# Patient Record
Sex: Female | Born: 1955 | Race: White | Hispanic: No | Marital: Single | Smoking: Never smoker
Health system: Southern US, Community
[De-identification: ages and names within clinical notes are randomized; demographics above are authoritative.]

## PROBLEM LIST (undated history)

## (undated) DIAGNOSIS — J449 Chronic obstructive pulmonary disease, unspecified: Secondary | ICD-10-CM

## (undated) DIAGNOSIS — N3010 Interstitial cystitis (chronic) without hematuria: Secondary | ICD-10-CM

## (undated) DIAGNOSIS — K589 Irritable bowel syndrome without diarrhea: Secondary | ICD-10-CM

## (undated) DIAGNOSIS — N200 Calculus of kidney: Secondary | ICD-10-CM

## (undated) HISTORY — PX: FOOT SURGERY: SHX648

## (undated) HISTORY — PX: ABDOMINAL HYSTERECTOMY: SHX81

## (undated) HISTORY — PX: KNEE SURGERY: SHX244

---

## 1997-08-02 ENCOUNTER — Ambulatory Visit (HOSPITAL_COMMUNITY): Admission: RE | Admit: 1997-08-02 | Discharge: 1997-08-02 | Payer: Self-pay | Admitting: *Deleted

## 1998-03-05 ENCOUNTER — Ambulatory Visit (HOSPITAL_COMMUNITY): Admission: RE | Admit: 1998-03-05 | Discharge: 1998-03-05 | Payer: Self-pay | Admitting: *Deleted

## 1998-03-05 ENCOUNTER — Other Ambulatory Visit: Admission: RE | Admit: 1998-03-05 | Discharge: 1998-03-05 | Payer: Self-pay | Admitting: *Deleted

## 1998-04-04 ENCOUNTER — Ambulatory Visit (HOSPITAL_COMMUNITY): Admission: RE | Admit: 1998-04-04 | Discharge: 1998-04-04 | Payer: Self-pay | Admitting: *Deleted

## 1998-06-05 ENCOUNTER — Other Ambulatory Visit: Admission: RE | Admit: 1998-06-05 | Discharge: 1998-06-05 | Payer: Self-pay | Admitting: Gastroenterology

## 1998-12-22 ENCOUNTER — Encounter: Admission: RE | Admit: 1998-12-22 | Discharge: 1999-03-22 | Payer: Self-pay | Admitting: *Deleted

## 1999-03-10 ENCOUNTER — Other Ambulatory Visit: Admission: RE | Admit: 1999-03-10 | Discharge: 1999-03-10 | Payer: Self-pay | Admitting: *Deleted

## 2000-07-07 ENCOUNTER — Encounter (INDEPENDENT_AMBULATORY_CARE_PROVIDER_SITE_OTHER): Payer: Self-pay

## 2000-07-07 ENCOUNTER — Other Ambulatory Visit: Admission: RE | Admit: 2000-07-07 | Discharge: 2000-07-07 | Payer: Self-pay | Admitting: Obstetrics and Gynecology

## 2001-08-16 ENCOUNTER — Ambulatory Visit (HOSPITAL_COMMUNITY): Admission: RE | Admit: 2001-08-16 | Discharge: 2001-08-16 | Payer: Self-pay | Admitting: Family Medicine

## 2001-08-16 ENCOUNTER — Encounter: Payer: Self-pay | Admitting: Family Medicine

## 2002-01-13 ENCOUNTER — Encounter: Payer: Self-pay | Admitting: Family Medicine

## 2002-01-13 ENCOUNTER — Encounter: Admission: RE | Admit: 2002-01-13 | Discharge: 2002-01-13 | Payer: Self-pay | Admitting: Family Medicine

## 2002-03-09 ENCOUNTER — Encounter: Admission: RE | Admit: 2002-03-09 | Discharge: 2002-03-09 | Payer: Self-pay | Admitting: *Deleted

## 2002-03-09 ENCOUNTER — Encounter: Payer: Self-pay | Admitting: *Deleted

## 2002-04-03 ENCOUNTER — Ambulatory Visit (HOSPITAL_COMMUNITY): Admission: RE | Admit: 2002-04-03 | Discharge: 2002-04-04 | Payer: Self-pay | Admitting: Cardiology

## 2005-02-11 ENCOUNTER — Encounter: Admission: RE | Admit: 2005-02-11 | Discharge: 2005-02-11 | Payer: Self-pay | Admitting: Internal Medicine

## 2005-07-08 ENCOUNTER — Ambulatory Visit (HOSPITAL_COMMUNITY): Admission: RE | Admit: 2005-07-08 | Discharge: 2005-07-08 | Payer: Self-pay | Admitting: Internal Medicine

## 2007-05-17 ENCOUNTER — Emergency Department (HOSPITAL_COMMUNITY): Admission: EM | Admit: 2007-05-17 | Discharge: 2007-05-17 | Payer: Self-pay | Admitting: Emergency Medicine

## 2008-01-01 ENCOUNTER — Ambulatory Visit: Payer: Self-pay | Admitting: Urology

## 2008-01-24 ENCOUNTER — Ambulatory Visit: Payer: Self-pay | Admitting: Urology

## 2008-02-01 ENCOUNTER — Ambulatory Visit: Payer: Self-pay | Admitting: Urology

## 2008-02-15 ENCOUNTER — Ambulatory Visit: Payer: Self-pay | Admitting: Urology

## 2008-04-05 ENCOUNTER — Ambulatory Visit: Payer: Self-pay | Admitting: Urology

## 2008-10-30 ENCOUNTER — Ambulatory Visit (HOSPITAL_COMMUNITY): Admission: RE | Admit: 2008-10-30 | Discharge: 2008-10-30 | Payer: Self-pay | Admitting: Family Medicine

## 2008-12-10 ENCOUNTER — Ambulatory Visit: Payer: Self-pay | Admitting: Urology

## 2009-02-05 ENCOUNTER — Emergency Department (HOSPITAL_COMMUNITY): Admission: EM | Admit: 2009-02-05 | Discharge: 2009-02-05 | Payer: Self-pay | Admitting: Family Medicine

## 2009-04-15 ENCOUNTER — Emergency Department (HOSPITAL_COMMUNITY): Admission: EM | Admit: 2009-04-15 | Discharge: 2009-04-15 | Payer: Self-pay | Admitting: Family Medicine

## 2009-05-11 ENCOUNTER — Emergency Department (HOSPITAL_COMMUNITY): Admission: EM | Admit: 2009-05-11 | Discharge: 2009-05-11 | Payer: Self-pay | Admitting: Family Medicine

## 2009-05-19 ENCOUNTER — Ambulatory Visit: Payer: Self-pay | Admitting: Internal Medicine

## 2009-06-11 ENCOUNTER — Ambulatory Visit: Payer: Self-pay | Admitting: Internal Medicine

## 2009-07-07 ENCOUNTER — Emergency Department (HOSPITAL_COMMUNITY): Admission: EM | Admit: 2009-07-07 | Discharge: 2009-07-07 | Payer: Self-pay | Admitting: Emergency Medicine

## 2009-07-16 ENCOUNTER — Ambulatory Visit (HOSPITAL_COMMUNITY): Admission: RE | Admit: 2009-07-16 | Discharge: 2009-07-16 | Payer: Self-pay | Admitting: Orthopedic Surgery

## 2009-08-11 ENCOUNTER — Ambulatory Visit: Payer: Self-pay | Admitting: Internal Medicine

## 2009-08-11 LAB — CONVERTED CEMR LAB
ALT: 11 units/L (ref 0–35)
Albumin: 4.3 g/dL (ref 3.5–5.2)
Alkaline Phosphatase: 79 units/L (ref 39–117)
Basophils Absolute: 0 10*3/uL (ref 0.0–0.1)
Cholesterol: 124 mg/dL (ref 0–200)
Creatinine, Ser: 0.57 mg/dL (ref 0.40–1.20)
Hemoglobin: 13.9 g/dL (ref 12.0–15.0)
Hgb A1c MFr Bld: 7.5 % — ABNORMAL HIGH (ref <5.7)
Lymphocytes Relative: 24 % (ref 12–46)
Lymphs Abs: 1.9 10*3/uL (ref 0.7–4.0)
Microalb, Ur: 1.59 mg/dL (ref 0.00–1.89)
Monocytes Relative: 7 % (ref 3–12)
Neutro Abs: 5.3 10*3/uL (ref 1.7–7.7)
RBC: 4.77 M/uL (ref 3.87–5.11)
RDW: 14.7 % (ref 11.5–15.5)
TSH: 0.809 microintl units/mL (ref 0.350–4.500)
Total CHOL/HDL Ratio: 3
Vit D, 25-Hydroxy: 36 ng/mL (ref 30–89)
WBC: 7.8 10*3/uL (ref 4.0–10.5)

## 2009-08-18 ENCOUNTER — Ambulatory Visit: Payer: Self-pay | Admitting: Internal Medicine

## 2009-08-19 ENCOUNTER — Encounter (INDEPENDENT_AMBULATORY_CARE_PROVIDER_SITE_OTHER): Payer: Self-pay | Admitting: Family Medicine

## 2009-11-18 ENCOUNTER — Ambulatory Visit: Payer: Self-pay | Admitting: Internal Medicine

## 2009-12-04 ENCOUNTER — Encounter (INDEPENDENT_AMBULATORY_CARE_PROVIDER_SITE_OTHER): Payer: Self-pay | Admitting: Family Medicine

## 2009-12-04 ENCOUNTER — Ambulatory Visit: Payer: Self-pay | Admitting: Internal Medicine

## 2009-12-04 LAB — CONVERTED CEMR LAB
Helicobacter Pylori Antibody-IgG: 0.5
Lipase: 50 U/L

## 2009-12-10 ENCOUNTER — Ambulatory Visit: Payer: Self-pay | Admitting: Urology

## 2010-04-15 ENCOUNTER — Encounter (INDEPENDENT_AMBULATORY_CARE_PROVIDER_SITE_OTHER): Payer: Self-pay | Admitting: *Deleted

## 2010-04-15 LAB — CONVERTED CEMR LAB
Albumin: 4.5 g/dL (ref 3.5–5.2)
CO2: 27 meq/L (ref 19–32)
Chloride: 103 meq/L (ref 96–112)
Cholesterol: 142 mg/dL (ref 0–200)
Creatinine, Ser: 0.51 mg/dL (ref 0.40–1.20)
Glucose, Bld: 101 mg/dL — ABNORMAL HIGH (ref 70–99)
HDL: 47 mg/dL (ref >39)
Total CHOL/HDL Ratio: 3

## 2010-04-19 ENCOUNTER — Encounter: Payer: Self-pay | Admitting: Internal Medicine

## 2010-06-16 LAB — GLUCOSE, CAPILLARY
Glucose-Capillary: 126 mg/dL — ABNORMAL HIGH (ref 70–99)
Glucose-Capillary: 147 mg/dL — ABNORMAL HIGH (ref 70–99)
Glucose-Capillary: 165 mg/dL — ABNORMAL HIGH (ref 70–99)
Glucose-Capillary: 166 mg/dL — ABNORMAL HIGH (ref 70–99)

## 2010-06-16 LAB — CBC
HCT: 42.5 % (ref 36.0–46.0)
Hemoglobin: 14.6 g/dL (ref 12.0–15.0)
MCHC: 34.3 g/dL (ref 30.0–36.0)
MCV: 89.4 fL (ref 78.0–100.0)
Platelets: 311 10*3/uL (ref 150–400)
RBC: 4.75 MIL/uL (ref 3.87–5.11)
RDW: 13 % (ref 11.5–15.5)
WBC: 11.2 10*3/uL — ABNORMAL HIGH (ref 4.0–10.5)

## 2010-06-16 LAB — BASIC METABOLIC PANEL
BUN: 10 mg/dL (ref 6–23)
CO2: 30 mEq/L (ref 19–32)
Calcium: 9.7 mg/dL (ref 8.4–10.5)
Chloride: 99 mEq/L (ref 96–112)
Creatinine, Ser: 0.67 mg/dL (ref 0.4–1.2)
GFR calc Af Amer: >60 mL/min (ref >60)
GFR calc non Af Amer: >60 mL/min (ref >60)
Glucose, Bld: 193 mg/dL — ABNORMAL HIGH (ref 70–99)
Potassium: 4.1 mEq/L (ref 3.5–5.1)
Sodium: 137 mEq/L (ref 135–145)

## 2010-08-11 ENCOUNTER — Inpatient Hospital Stay (INDEPENDENT_AMBULATORY_CARE_PROVIDER_SITE_OTHER)
Admission: RE | Admit: 2010-08-11 | Discharge: 2010-08-11 | Disposition: A | Discharge status: Home / Self Care | Payer: Self-pay | Source: Ambulatory Visit | Attending: Family Medicine | Admitting: Family Medicine

## 2010-08-11 DIAGNOSIS — N12 Tubulo-interstitial nephritis, not specified as acute or chronic: Secondary | ICD-10-CM

## 2010-08-11 LAB — POCT URINALYSIS DIP (DEVICE)
Glucose, UA: NEGATIVE mg/dL
Nitrite: POSITIVE — AB
Protein, ur: NEGATIVE mg/dL
Specific Gravity, Urine: 1.015 (ref 1.005–1.030)
pH: 5.5 (ref 5.0–8.0)

## 2010-08-11 LAB — DIFFERENTIAL
Eosinophils Relative: 0 % (ref 0–5)
Lymphocytes Relative: 5 % — ABNORMAL LOW (ref 12–46)
Lymphs Abs: 0.9 10*3/uL (ref 0.7–4.0)
Monocytes Relative: 8 % (ref 3–12)
Neutrophils Relative %: 87 % — ABNORMAL HIGH (ref 43–77)

## 2010-08-11 LAB — POCT I-STAT, CHEM 8
Creatinine, Ser: 0.7 mg/dL (ref 0.4–1.2)
Glucose, Bld: 152 mg/dL — ABNORMAL HIGH (ref 70–99)

## 2010-08-11 LAB — CBC
HCT: 41.3 % (ref 36.0–46.0)
MCH: 30.4 pg (ref 26.0–34.0)
MCHC: 34.4 g/dL (ref 30.0–36.0)
MCV: 88.4 fL (ref 78.0–100.0)
RBC: 4.67 MIL/uL (ref 3.87–5.11)
RDW: 12.9 % (ref 11.5–15.5)

## 2010-08-14 NOTE — Cardiovascular Report (Signed)
   NAME:  Patricia Mccall, Patricia Mccall                           ACCOUNT NO.:  [id number]   MEDICAL RECORD NO.:  [id number]                   PATIENT TYPE:  OIB   LOCATION:  2899                                 FACILITY:  [facility]   PHYSICIAN:  Peter M. Swaziland, M.D.               DATE OF BIRTH:  June 12, 1955   DATE OF PROCEDURE:  04/03/2002  DATE OF DISCHARGE:                              CARDIAC CATHETERIZATION   INDICATIONS FOR PROCEDURE:  The patient is a 55 year old white female with a  history of obesity, diabetes mellitus and tobacco abuse.  She has had an  abnormal stress Cardiolite procedure suggesting anterior wall ischemia.   ACCESS:  Via the right femoral artery using the standard Seldinger  technique.   EQUIPMENT:  The 6 French 4 cm right and left Judkins catheter, 6 French  pigtail catheter, 6 French arterial sheath.   MEDICATIONS:  Local anesthesia with 1% Xylocaine, Versed 2 mg IV.   CONTRAST:  Omnipaque 85 cubic centimeter.   HEMODYNAMIC DATA:  Aortic pressure is 105/62 with a mean of 81.  Left  ventricular  pressure is 112 with an EDP of 21 mmHg.   ANGIOGRAPHIC DATA:  Left coronary artery:  The left coronary artery arises  and distributes normally.   Left main:  The left main coronary artery is long and normal.   Left anterior descending:  The left anterior descending artery is normal.   Left circumflex:  The left circumflex coronary artery is normal.   Right coronary artery:  The right coronary artery has minor irregularities  proximally, less than 10%.   LEFT VENTRICULAR ANGIOGRAPHY:  The left ventricular angiography performed in  the RAO view demonstrates normal left ventricular size and contractility  with normal systolic function.  Ejection fraction is estimated at 70%.  There is no mitral regurgitation or prolapse.    FINAL INTERPRETATION:  1. Normal coronary anatomy.  2. Normal left ventricular function.          Peter M. Swaziland, M.D.    PMJ/MEDQ  D:  04/03/2002  T:  04/03/2002  Job:  [id number]   cc:   Caryl Comes. Slotnick, M.D.  [address] [place]  [place] [address]  Fax: [phone]

## 2010-08-14 NOTE — H&P (Signed)
NAME:  Patricia Mccall, Patricia Mccall NO.:  [id number]   MEDICAL RECORD NO.:  [id number]                   PATIENT TYPE:   LOCATION:                                       FACILITY:   PHYSICIAN:  Peter M. Swaziland, M.D.               DATE OF BIRTH:  1955/07/16   DATE OF ADMISSION:  04/03/2002  DATE OF DISCHARGE:                                HISTORY & PHYSICAL   HISTORY OF PRESENT ILLNESS:  The patient is a 55 year old white female with  a history of obesity, tobacco abuse, and type 2 diabetes mellitus.  She  presented with symptoms of just not feeling well.  She denied any specific  chest discomfort or shortness of breath.  She underwent a screening exercise  stress test with Dr. Lowanda Foster, and was noted to have some ST segment  changes, but had no chest pain.  She subsequently underwent an Adenosine  Cardiolite study which showed a moderate to large defect in the anterior  wall consistent with ischemia.  Her left ventricular function was mildly  depressed, with an ejection fraction of 45%.  Based on these findings,  cardiac catheterization has been recommended, and she is now admitted for  that procedure.   PAST MEDICAL HISTORY:  1. Interstitial cystitis.  2. History of irritable bowel syndrome.  3. History of type 2 diabetes mellitus.   PAST SURGICAL HISTORY:  1. Hysterectomy.  2. Bladder surgery.  3. Cesarean section.  4. Foot surgery.   ALLERGIES:  1. AUGMENTIN.  2. She has had some GI upset with ASPIRIN in the past.   MEDICATIONS:  1. Elmiron 100 mg t.i.d.  2. Buspirone 50 mg b.i.d.  3. Prevacid 30 mg q.d.  4. Glucotrol XL 2.5 mg q.d.  5. Glucophage 500 mg b.i.d.  6. Effexor XR 150 mg q.d.  7. Astelin 34 cc two sprays q.d.  8. Flonase p.r.n.  9. Roxicet p.r.n.  10.      Univasc 7.5 mg q.d.  11.      Toprol XL 50 mg q.d.  12.      Coated aspirin q.d.   SOCIAL HISTORY:  The patient works at [employer].  She is single.  She has one  child.  She smokes 1-1/2 packs per day, and has smoked for 30  years.  She denies alcohol use.   FAMILY HISTORY:  Father is age 38, in good health.  Mother is age 38, and  has diabetes.  One sister has diabetes.  One sister is in good health.   REVIEW OF SYMPTOMS:  The patient complains of arthritic pain in her right  knee.  She has no edema.  She denies orthopnea or PND.  All other review of  systems are negative.   PHYSICAL EXAMINATION:  GENERAL:  The patient is an obese white female in no  acute distress.  VITAL SIGNS:  Weight is 224, blood pressure is 132/80, pulse is 80 and  regular.  HEENT:  Pupils equal, round, reactive to light and accommodation.  Extraocular movements are full.  Oropharynx is clear.  NECK:  Without JVD, adenopathy, thyromegaly, or bruits.  LUNGS:  Clear to auscultation and percussion.  CARDIAC:  Regular rate and rhythm without murmurs, rubs, gallops, or clicks.  ABDOMEN:  Soft and nontender.  She has no masses or bruits.  EXTREMITIES:  Without edema.  Pulses are 2+ and symmetric.  NEUROLOGIC:  Nonfocal.   LABORATORY DATA:  Electrocardiogram shows normal sinus rhythm, normal  electrocardiogram.  Chest x-ray shows no active disease.   IMPRESSION:  1. Symptoms of fatigue.  2. Abnormal Adenosine Cardiolite study showing evidence of anterior wall     ischemia.  3. Type 2 diabetes mellitus.  4. Tobacco abuse.  5. Obesity.  6. History of irritable bowel syndrome.  7. History of interstitial cystitis.   PLAN:  The patient will be admitted for cardiac catheterization with further  therapy pending these results.                                                Peter M. Swaziland, M.D.    PMJ/MEDQ  D:  03/21/2002  T:  03/21/2002  Job:  [id number]   cc:   Caryl Comes. Slotnick, M.D.  [address] [place]  [place] [address]  Fax: [phone]

## 2011-04-19 ENCOUNTER — Emergency Department (INDEPENDENT_AMBULATORY_CARE_PROVIDER_SITE_OTHER): Payer: Self-pay

## 2011-04-19 ENCOUNTER — Encounter (HOSPITAL_COMMUNITY): Payer: Self-pay

## 2011-04-19 ENCOUNTER — Emergency Department (HOSPITAL_COMMUNITY)
Admission: EM | Admit: 2011-04-19 | Discharge: 2011-04-19 | Disposition: A | Discharge status: Home / Self Care | Payer: Self-pay | Source: Home / Self Care | Attending: Emergency Medicine | Admitting: Emergency Medicine

## 2011-04-19 DIAGNOSIS — M659 Synovitis and tenosynovitis, unspecified: Secondary | ICD-10-CM

## 2011-04-19 DIAGNOSIS — M7750 Other enthesopathy of unspecified foot: Secondary | ICD-10-CM

## 2011-04-19 HISTORY — DX: Calculus of kidney: N20.0

## 2011-04-19 HISTORY — DX: Chronic obstructive pulmonary disease, unspecified: J44.9

## 2011-04-19 HISTORY — DX: Irritable bowel syndrome without diarrhea: K58.9

## 2011-04-19 HISTORY — DX: Interstitial cystitis (chronic) without hematuria: N30.10

## 2011-04-19 MED ORDER — MELOXICAM 7.5 MG PO TABS: 7.5000 mg | ORAL_TABLET | Freq: Every day | ORAL | Status: AC

## 2011-04-19 NOTE — ED Notes (Signed)
C/o painful knot to lateral aspect of lt foot for 2 months.  Denies injury.

## 2011-04-19 NOTE — ED Provider Notes (Signed)
History     CSN: [id number]  Arrival date & time 04/19/11  1349   First MD Initiated Contact with Patient 04/19/11 1356      Chief Complaint  Patient presents with  . Foot Pain    (Consider location/radiation/quality/duration/timing/severity/associated sxs/prior treatment) HPI Comments: The side of my R foot has been hurting for two months, I do work standing up for many hours during the day"  Patient is a 56 y.o. female presenting with lower extremity pain. The history is provided by the patient.  Foot Pain This is a new problem. Episode onset: x 2 months. The problem occurs constantly. The problem has been gradually worsening. Pertinent negatives include no chest pain and no abdominal pain. The symptoms are aggravated by nothing. The symptoms are relieved by nothing.    Past Medical History  Diagnosis Date  . Diabetes mellitus   . COPD (chronic obstructive pulmonary disease)   . Irritable bowel syndrome (IBS)   . Interstitial cystitis   . Kidney stones     Past Surgical History  Procedure Date  . Abdominal hysterectomy   . Knee surgery   . Foot surgery     No family history on file.  History  Substance Use Topics  . Smoking status: Never Smoker   . Smokeless tobacco: Not on file  . Alcohol Use: No    OB History    Grav Para Term Preterm Abortions TAB SAB Ect Mult Living                  Review of Systems  Constitutional: Negative for fever and chills.  Cardiovascular: Negative for chest pain.  Gastrointestinal: Negative for abdominal pain.  Musculoskeletal: Negative for gait problem.  Neurological: Negative for weakness and numbness.    Allergies  Aspirin; Augmentin; and Levaquin  Home Medications   Current Outpatient Rx  Name Route Sig Dispense Refill  . VENTOLIN IN Inhalation Inhale into the lungs.    . BUSPIRONE HCL 15 MG PO TABS Oral Take 15 mg by mouth 2 (two) times daily.    Marland Kitchen CETIRIZINE HCL 10 MG PO TABS Oral Take 10 mg by mouth daily.      Marland Kitchen FLUTICASONE PROPIONATE 50 MCG/ACT NA SUSP Nasal Place 2 sprays into the nose daily.    . IMIPRAMINE HCL 25 MG PO TABS Oral Take 25 mg by mouth at bedtime.    Marland Kitchen LIRAGLUTIDE 18 MG/3ML [place] SOLN Subcutaneous Inject into the skin.    Marland Kitchen METFORMIN HCL 1000 MG PO TABS Oral Take 1,000 mg by mouth 2 (two) times daily with a meal.    . MONTELUKAST SODIUM 10 MG PO TABS Oral Take 10 mg by mouth at bedtime.    Marland Kitchen PANTOPRAZOLE SODIUM 40 MG PO TBEC Oral Take 40 mg by mouth daily.    Marland Kitchen PAROXETINE HCL 20 MG PO TABS Oral Take 20 mg by mouth daily.    Marland Kitchen PENTOSAN POLYSULFATE SODIUM 100 MG PO CAPS Oral Take 100 mg by mouth 3 (three) times daily before meals.    . TELMISARTAN-HCTZ 40-12.5 MG PO TABS Oral Take 1 tablet by mouth daily.    Marland Kitchen TIOTROPIUM BROMIDE MONOHYDRATE 18 MCG IN CAPS Inhalation Place 18 mcg into inhaler and inhale daily.      BP 122/66  Pulse 79  Temp(Src) 98.4 F (36.9 C) (Oral)  Resp 18  SpO2 99%  Physical Exam  Nursing note and vitals reviewed. Constitutional: She appears well-developed and well-nourished.  HENT:  Head: Normocephalic.  Neck: Neck supple.  Musculoskeletal: She exhibits tenderness. She exhibits no edema.       Right ankle: She exhibits swelling. She exhibits no deformity, no laceration and normal pulse. tenderness. Achilles tendon normal.       Left foot: She exhibits tenderness, bony tenderness and swelling. She exhibits no crepitus and no laceration.       Feet:  Neurological: She is alert. No cranial nerve deficit. She exhibits normal muscle tone. Coordination normal.  Skin: No rash noted. No erythema.    ED Course  Procedures (including critical care time)  Labs Reviewed - No data to display No results found.   No diagnosis found.    MDM  Foot pain with lateral dorsal and plantar surface reproducible pain- 5 th metatarsal regio ruling a stress fracture-        Jimmie Molly, MD 04/19/11 [phone]

## 2012-01-24 IMAGING — CR DG ELBOW COMPLETE 3+V*L*
4 series · 4 of 4 positions shown · non-contrast
Comparison: None.

CLINICAL DATA: The patient fell.  Pain.

LEFT ELBOW - COMPLETE 3+ VIEW

[x elbow joint ap left]
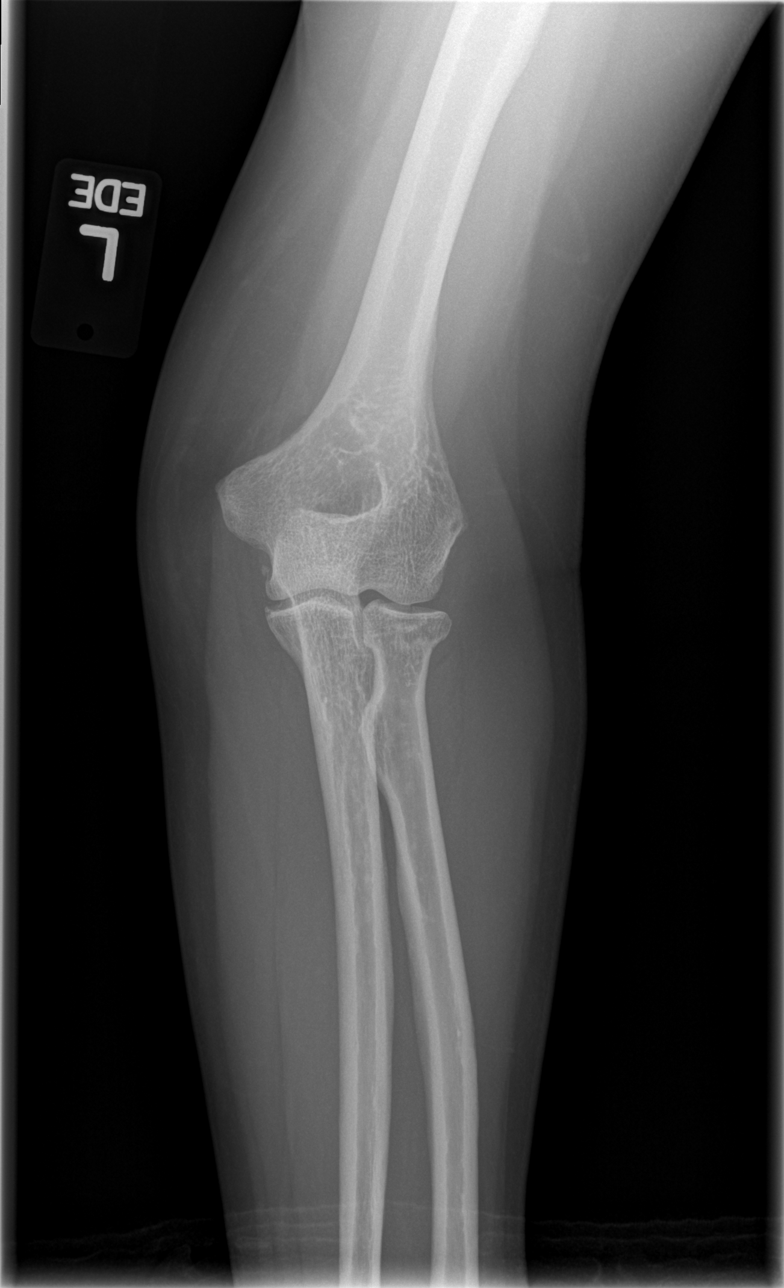

[x elbow joint obl. left (1 of 2)]
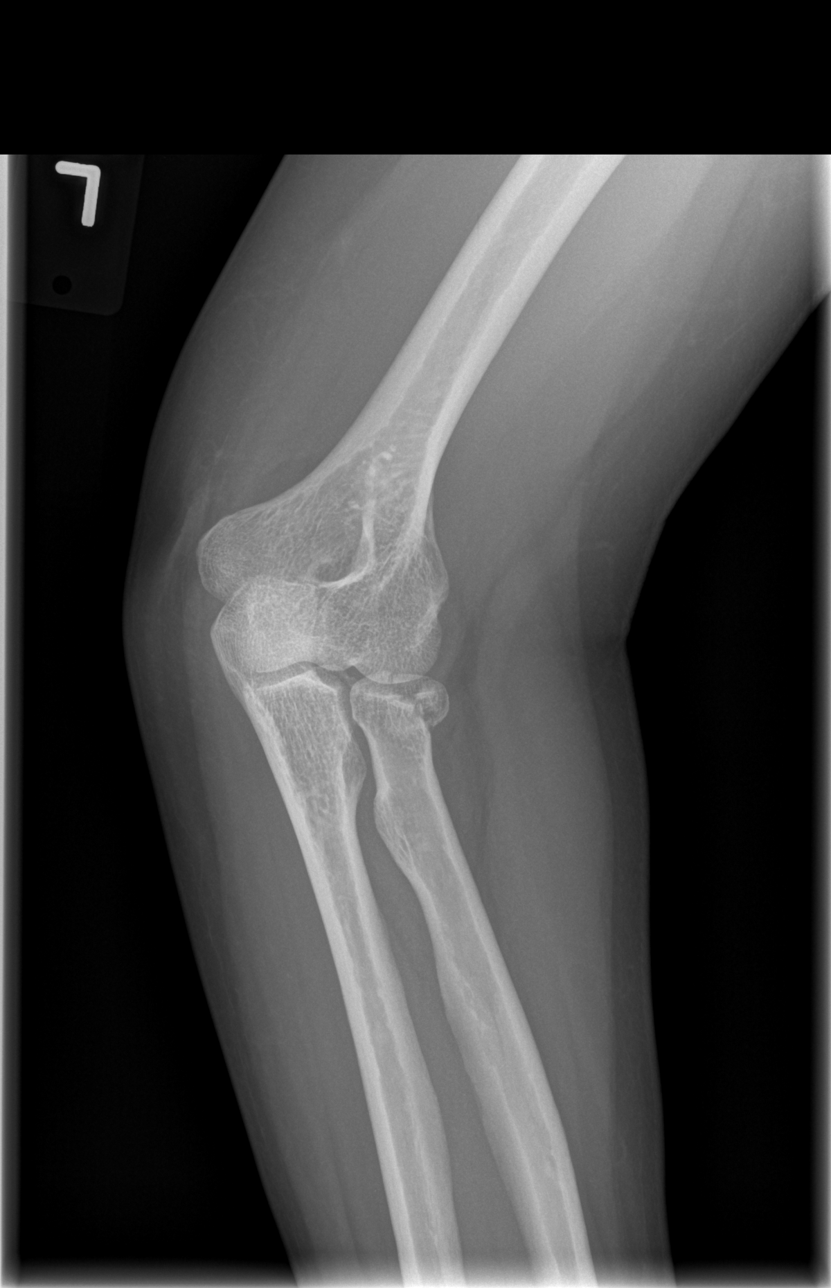

[x elbow joint obl. left (2 of 2)]
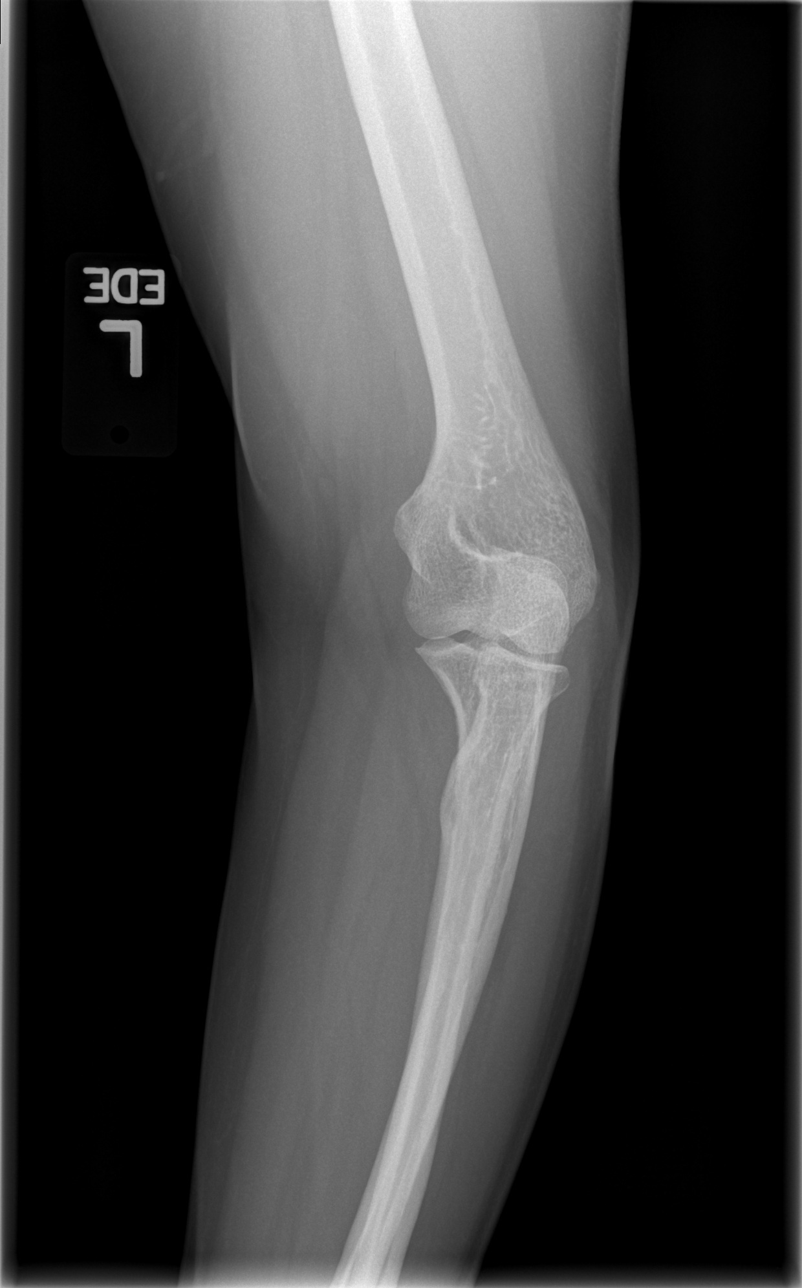

[x elbow joint lat left]
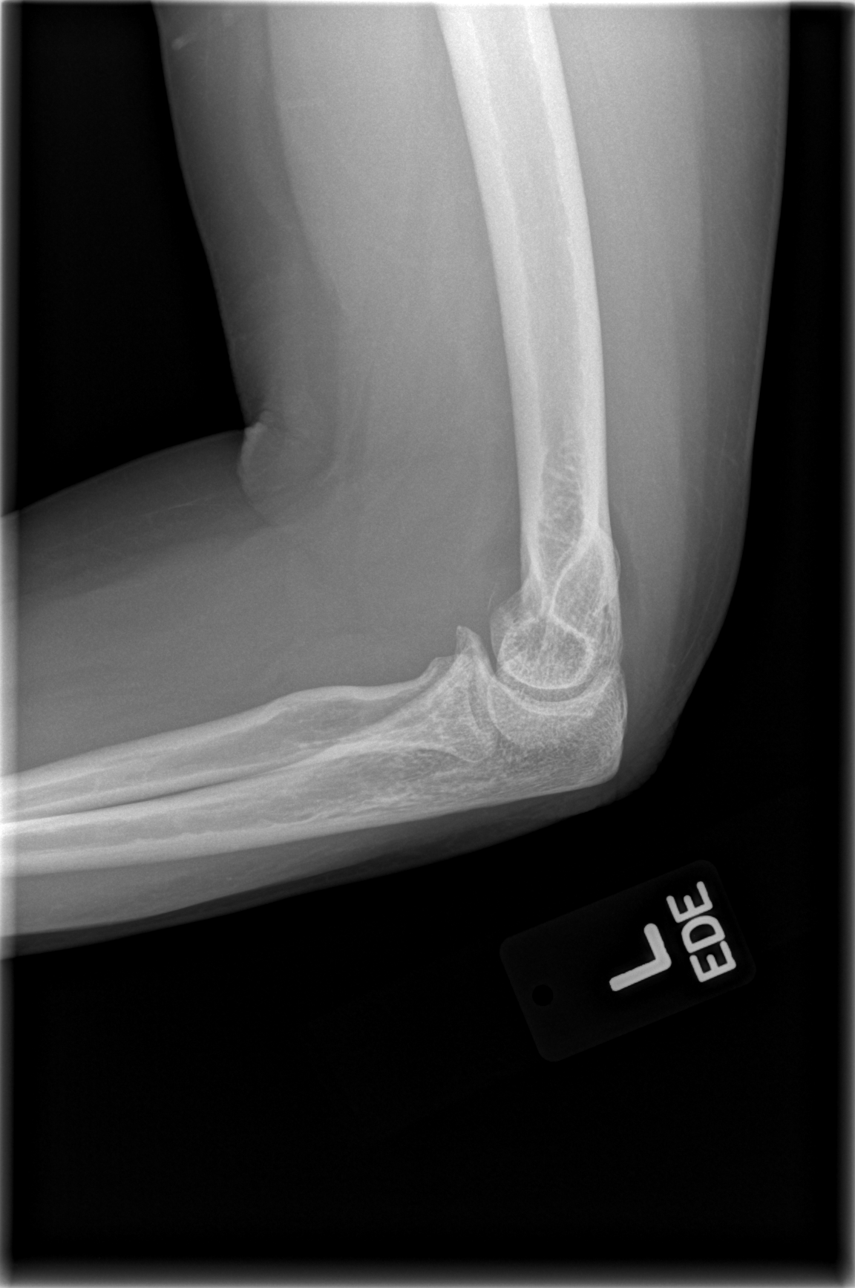

[4 of 4 positions shown; findings below may reference images not displayed]

FINDINGS: Slightly displaced fracture of the radial head.  Elbow
joint effusion/hemarthrosis.
IMPRESSION: Left radial head fracture.  Effusion.

## 2012-01-31 IMAGING — CR DG CHEST 2V
2 series · 2 of 2 positions shown · non-contrast
Comparison: February 11, 2005

CLINICAL DATA: Preoperative respiratory exam; left elbow fracture

CHEST - 2 VIEW

[view not recorded (1 of 2)]
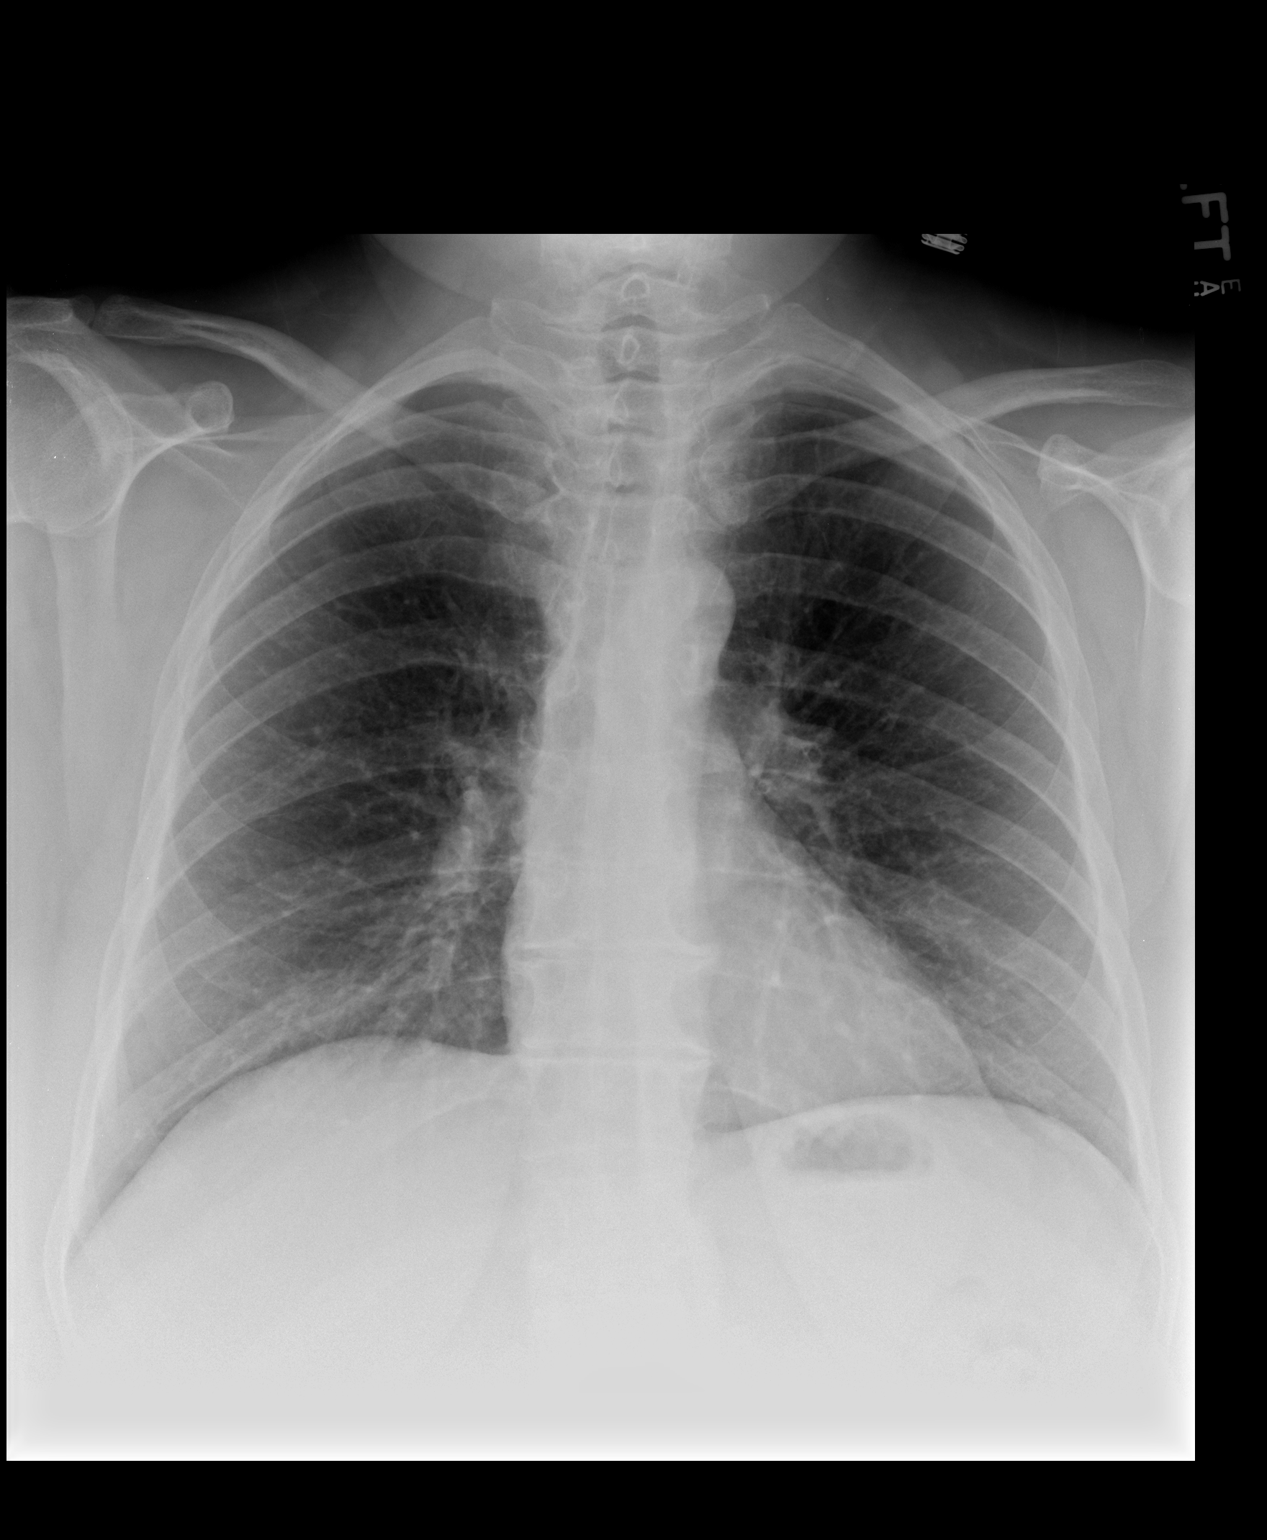

[view not recorded (2 of 2)]
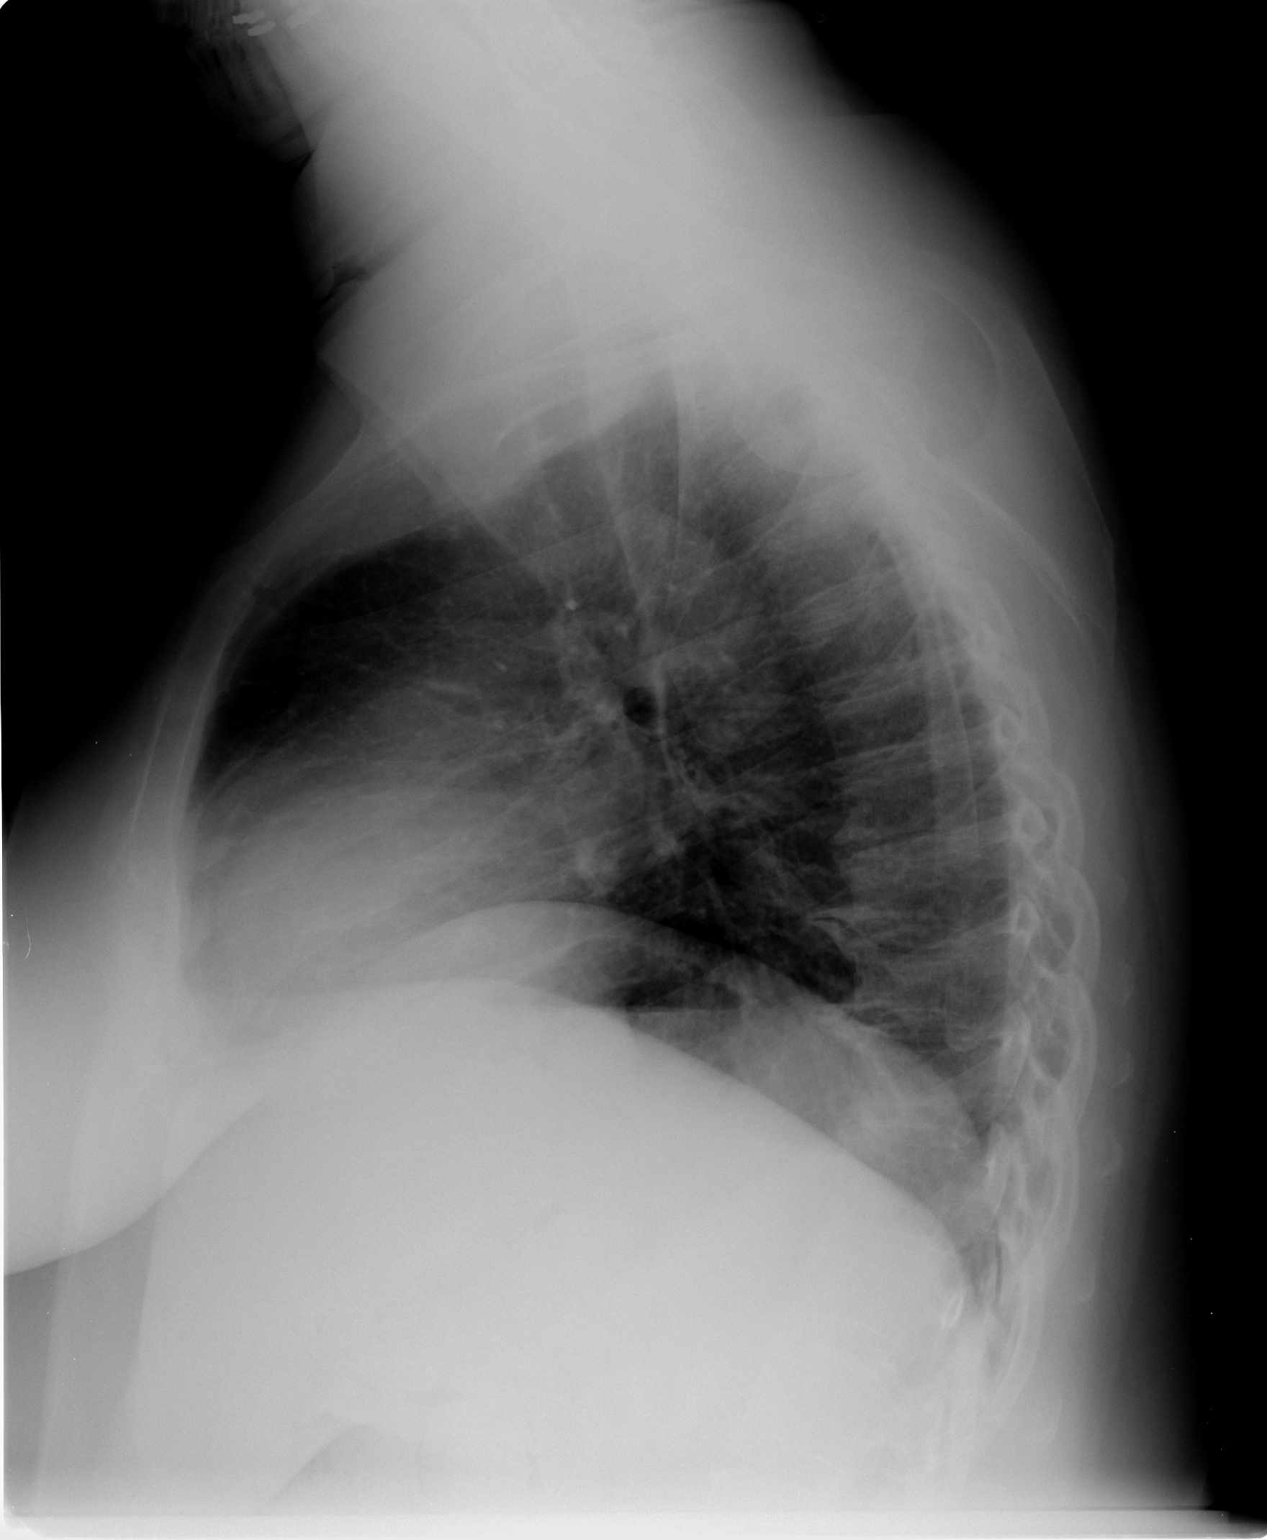

[2 of 2 positions shown; findings below may reference images not displayed]

FINDINGS: The cardiac silhouette, mediastinum, pulmonary
vasculature are within normal limits.  Both lungs are clear.
There is no acute bony abnormality.
IMPRESSION: Stable, normal chest x-ray.

## 2012-05-16 ENCOUNTER — Other Ambulatory Visit (HOSPITAL_COMMUNITY): Payer: Self-pay | Admitting: Nurse Practitioner

## 2012-05-16 DIAGNOSIS — Z1231 Encounter for screening mammogram for malignant neoplasm of breast: Secondary | ICD-10-CM

## 2012-05-16 DIAGNOSIS — Z0000 Encounter for general adult medical examination without abnormal findings: Secondary | ICD-10-CM

## 2012-05-26 ENCOUNTER — Ambulatory Visit (HOSPITAL_COMMUNITY)
Admission: RE | Admit: 2012-05-26 | Discharge: 2012-05-26 | Disposition: A | Discharge status: Home / Self Care | Payer: No Typology Code available for payment source | Source: Ambulatory Visit | Attending: Nurse Practitioner | Admitting: Nurse Practitioner

## 2012-05-26 ENCOUNTER — Other Ambulatory Visit (HOSPITAL_COMMUNITY): Payer: Self-pay | Admitting: Nurse Practitioner

## 2012-05-26 DIAGNOSIS — Z13820 Encounter for screening for osteoporosis: Secondary | ICD-10-CM | POA: Insufficient documentation

## 2012-05-26 DIAGNOSIS — Z1231 Encounter for screening mammogram for malignant neoplasm of breast: Secondary | ICD-10-CM | POA: Insufficient documentation

## 2012-05-26 DIAGNOSIS — Z780 Asymptomatic menopausal state: Secondary | ICD-10-CM | POA: Insufficient documentation

## 2013-07-11 ENCOUNTER — Ambulatory Visit: Payer: Self-pay | Admitting: Urology

## 2014-07-05 ENCOUNTER — Emergency Department (INDEPENDENT_AMBULATORY_CARE_PROVIDER_SITE_OTHER)
Admission: EM | Admit: 2014-07-05 | Discharge: 2014-07-05 | Disposition: A | Discharge status: Home / Self Care | Payer: BLUE CROSS/BLUE SHIELD | Source: Home / Self Care | Attending: Family Medicine | Admitting: Family Medicine

## 2014-07-05 ENCOUNTER — Encounter (HOSPITAL_COMMUNITY): Payer: Self-pay | Admitting: Emergency Medicine

## 2014-07-05 DIAGNOSIS — H8110 Benign paroxysmal vertigo, unspecified ear: Secondary | ICD-10-CM | POA: Diagnosis not present

## 2014-07-05 MED ORDER — MECLIZINE HCL 25 MG PO TABS: 25.0000 mg | ORAL_TABLET | Freq: Four times a day (QID) | ORAL | Status: AC | PRN

## 2014-07-05 MED ORDER — MECLIZINE HCL 25 MG PO TABS: 25.0000 mg | ORAL_TABLET | Freq: Four times a day (QID) | ORAL | Status: DC | PRN

## 2014-07-05 MED ORDER — PREDNISONE 20 MG PO TABS: 40.0000 mg | ORAL_TABLET | Freq: Every day | ORAL | Status: DC

## 2014-07-05 MED ORDER — PREDNISONE 20 MG PO TABS: 40.0000 mg | ORAL_TABLET | Freq: Every day | ORAL | Status: AC

## 2014-07-05 NOTE — ED Provider Notes (Signed)
CSN: [id number]     Arrival date & time 07/05/14  1020 History   First MD Initiated Contact with Patient 07/05/14 1117     Chief Complaint  Patient presents with  . Dizziness   (Consider location/radiation/quality/duration/timing/severity/associated sxs/prior Treatment) HPI       59 year old female presents complaining of dizziness. This started as a cold with some nasal congestion and rhinorrhea 2 days ago. She also has a mild dry cough. Yesterday she started feeling dizzy. She describes this as feeling like the room spins around her. His gait when she sits still and is worse when she moves her head quickly or tries to lay down. She has felt slightly nauseous with this. No vomiting. No numbness or weakness. Denies any other associated symptoms. She's never had this before.  Past Medical History  Diagnosis Date  . Diabetes mellitus   . COPD (chronic obstructive pulmonary disease)   . Irritable bowel syndrome (IBS)   . Interstitial cystitis   . Kidney stones    Past Surgical History  Procedure Laterality Date  . Abdominal hysterectomy    . Knee surgery    . Foot surgery     No family history on file. History  Substance Use Topics  . Smoking status: Never Smoker   . Smokeless tobacco: Not on file  . Alcohol Use: No   OB History    No data available     Review of Systems  Constitutional: Negative for fever and chills.  HENT: Positive for congestion and rhinorrhea. Negative for sore throat.   Respiratory: Positive for cough. Negative for shortness of breath.   Cardiovascular: Negative for chest pain.  Gastrointestinal: Positive for nausea. Negative for vomiting and diarrhea.  Neurological: Positive for dizziness. Negative for weakness and numbness.  All other systems reviewed and are negative.   Allergies  Amoxicillin-pot clavulanate; Aspirin; and Levofloxacin  Home Medications   Prior to Admission medications   Medication Sig Start Date End Date Taking? Authorizing  Provider  cetirizine (ZYRTEC) 10 MG tablet Take 10 mg by mouth daily.   Yes Historical Provider, MD  fluticasone (FLONASE) 50 MCG/ACT nasal spray Place 2 sprays into the nose daily.   Yes Historical Provider, MD  metFORMIN (GLUCOPHAGE) 1000 MG tablet Take 1,000 mg by mouth 2 (two) times daily with a meal.   Yes Historical Provider, MD  montelukast (SINGULAIR) 10 MG tablet Take 10 mg by mouth at bedtime.   Yes Historical Provider, MD  pantoprazole (PROTONIX) 40 MG tablet Take 40 mg by mouth daily.   Yes Historical Provider, MD  PARoxetine (PAXIL) 20 MG tablet Take 20 mg by mouth daily.   Yes Historical Provider, MD  Albuterol (VENTOLIN IN) Inhale into the lungs.    Historical Provider, MD  busPIRone (BUSPAR) 15 MG tablet Take 15 mg by mouth 2 (two) times daily.    Historical Provider, MD  imipramine (TOFRANIL) 25 MG tablet Take 25 mg by mouth at bedtime.    Historical Provider, MD  Liraglutide (VICTOZA) 18 MG/3ML SOLN Inject into the skin.    Historical Provider, MD  meclizine (ANTIVERT) 25 MG tablet Take 1 tablet (25 mg total) by mouth every 6 (six) hours as needed for dizziness or nausea. 07/05/14   Graylon GoodZachary H Luellen Howson, PA-C  pentosan polysulfate (ELMIRON) 100 MG capsule Take 100 mg by mouth 3 (three) times daily before meals.    Historical Provider, MD  predniSONE (DELTASONE) 20 MG tablet Take 2 tablets (40 mg total) by mouth daily with  breakfast. 07/05/14   Graylon Good, PA-C  telmisartan-hydrochlorothiazide (MICARDIS HCT) 40-12.5 MG per tablet Take 1 tablet by mouth daily.    Historical Provider, MD  tiotropium (SPIRIVA) 18 MCG inhalation capsule Place 18 mcg into inhaler and inhale daily.    Historical Provider, MD   BP 117/49 mmHg  Pulse 68  Temp(Src) 97.9 F (36.6 C) (Oral)  Resp 14  SpO2 99% Physical Exam  Constitutional: She is oriented to person, place, and time. Vital signs are normal. She appears well-developed and well-nourished. No distress.  HENT:  Head: Normocephalic and  atraumatic.  Right Ear: Tympanic membrane, external ear and ear canal normal.  Left Ear: Tympanic membrane, external ear and ear canal normal.  Nose: Nose normal. Right sinus exhibits no maxillary sinus tenderness and no frontal sinus tenderness. Left sinus exhibits no maxillary sinus tenderness and no frontal sinus tenderness.  Mouth/Throat: Uvula is midline, oropharynx is clear and moist and mucous membranes are normal. No oropharyngeal exudate or posterior oropharyngeal erythema.  Eyes: Conjunctivae and EOM are normal. Pupils are equal, round, and reactive to light.  Neck: Normal range of motion. Neck supple.  Cardiovascular: Normal rate, regular rhythm and normal heart sounds.   Pulmonary/Chest: Effort normal and breath sounds normal. No respiratory distress.  Lymphadenopathy:    She has no cervical adenopathy.  Neurological: She is alert and oriented to person, place, and time. She has normal strength and normal reflexes. No sensory deficit. She exhibits normal muscle tone. She displays a negative Romberg sign. Coordination and gait normal.  Skin: Skin is warm and dry. No rash noted. She is not diaphoretic.  Psychiatric: She has a normal mood and affect. Judgment normal.  Nursing note and vitals reviewed.   ED Course  Procedures (including critical care time) Labs Review Labs Reviewed - No data to display  Imaging Review No results found.   MDM   1. BPPV (benign paroxysmal positional vertigo), unspecified laterality    Benign positional vertigo in the setting of a viral URI. No signs of bacterial infection. Treat symptomatically with meclizine and prednisone for 3 days. She has type 2 diabetes but this is well controlled, last hemoglobin A1c was 6.7 and average blood sugars range in the 100-110 range fasting. Stop prednisone if her blood sugar gets over 300 and call her doctor  Meds ordered this encounter  Medications  . DISCONTD: predniSONE (DELTASONE) 20 MG tablet    Sig:  Take 2 tablets (40 mg total) by mouth daily with breakfast.    Dispense:  6 tablet    Refill:  0  . DISCONTD: meclizine (ANTIVERT) 25 MG tablet    Sig: Take 1 tablet (25 mg total) by mouth every 6 (six) hours as needed for dizziness or nausea.    Dispense:  20 tablet    Refill:  0  . predniSONE (DELTASONE) 20 MG tablet    Sig: Take 2 tablets (40 mg total) by mouth daily with breakfast.    Dispense:  6 tablet    Refill:  0  . meclizine (ANTIVERT) 25 MG tablet    Sig: Take 1 tablet (25 mg total) by mouth every 6 (six) hours as needed for dizziness or nausea.    Dispense:  20 tablet    Refill:  0       Graylon Good, PA-C 07/05/14 1229

## 2014-07-05 NOTE — ED Notes (Signed)
C/o constant dizziness onset Thursday Sx increases when she moves or bends over Also reports cold sx onset 2 days w/a HA Denies CP, weakness, blurry vision Alert, no signs of acute distress.

## 2014-07-05 NOTE — Discharge Instructions (Signed)
Epley Maneuver Self-Care WHAT IS THE EPLEY MANEUVER? The Epley maneuver is an exercise you can do to relieve symptoms of benign paroxysmal positional vertigo (BPPV). This condition is often just referred to as vertigo. BPPV is caused by the movement of tiny crystals (canaliths) inside your inner ear. The accumulation and movement of canaliths in your inner ear causes a sudden spinning sensation (vertigo) when you move your head to certain positions. Vertigo usually lasts about 30 seconds. BPPV usually occurs in just one ear. If you get vertigo when you lie on your left side, you probably have BPPV in your left ear. Your health care provider can tell you which ear is involved.  BPPV may be caused by a head injury. Many people older than 50 get BPPV for unknown reasons. If you have been diagnosed with BPPV, your health care provider may teach you how to do this maneuver. BPPV is not life threatening (benign) and usually goes away in time.  WHEN SHOULD I PERFORM THE EPLEY MANEUVER? You can do this maneuver at home whenever you have symptoms of vertigo. You may do the Epley maneuver up to 3 times a day until your symptoms of vertigo go away. HOW SHOULD I DO THE EPLEY MANEUVER?  Sit on the edge of a bed or table with your back straight. Your legs should be extended or hanging over the edge of the bed or table.   Turn your head halfway toward the affected ear.   Lie backward quickly with your head turned until you are lying flat on your back. You may want to position a pillow under your shoulders.   Hold this position for 30 seconds. You may experience an attack of vertigo. This is normal. Hold this position until the vertigo stops.  Then turn your head to the opposite direction until your unaffected ear is facing the floor.   Hold this position for 30 seconds. You may experience an attack of vertigo. This is normal. Hold this position until the vertigo stops.  Now turn your whole body to the same  side as your head. Hold for another 30 seconds.   You can then sit back up. ARE THERE RISKS TO THIS MANEUVER? In some cases, you may have other symptoms (such as changes in your vision, weakness, or numbness). If you have these symptoms, stop doing the maneuver and call your health care provider. Even if doing these maneuvers relieves your vertigo, you may still have dizziness. Dizziness is the sensation of light-headedness but without the sensation of movement. Even though the Epley maneuver may relieve your vertigo, it is possible that your symptoms will return within 5 years. WHAT SHOULD I DO AFTER THIS MANEUVER? After doing the Epley maneuver, you can return to your normal activities. Ask your doctor if there is anything you should do at home to prevent vertigo. This may include:  Sleeping with two or more pillows to keep your head elevated.  Not sleeping on the side of your affected ear.  Getting up slowly from bed.  Avoiding sudden movements during the day.  Avoiding extreme head movement, like looking up or bending over.  Wearing a cervical collar to prevent sudden head movements. WHAT SHOULD I DO IF MY SYMPTOMS GET WORSE? Call your health care provider if your vertigo gets worse. Call your provider right way if you have other symptoms, including:   Nausea.  Vomiting.  Headache.  Weakness.  Numbness.  Vision changes. Document Released: 03/20/2013 Document Reviewed: 03/20/2013 ExitCare   Patient Information 2015 [employer]. This information is not intended to replace advice given to you by your health care provider. Make sure you discuss any questions you have with your health care provider.   Benign Positional Vertigo Vertigo means you feel like you or your surroundings are moving when they are not. Benign positional vertigo is the most common form of vertigo. Benign means that the cause of your condition is not serious. Benign positional vertigo is more common in  older adults. CAUSES  Benign positional vertigo is the result of an upset in the labyrinth system. This is an area in the middle ear that helps control your balance. This may be caused by a viral infection, head injury, or repetitive motion. However, often no specific cause is found. SYMPTOMS  Symptoms of benign positional vertigo occur when you move your head or eyes in different directions. Some of the symptoms may include:  Loss of balance and falls.  Vomiting.  Blurred vision.  Dizziness.  Nausea.  Involuntary eye movements (nystagmus). DIAGNOSIS  Benign positional vertigo is usually diagnosed by physical exam. If the specific cause of your benign positional vertigo is unknown, your caregiver may perform imaging tests, such as magnetic resonance imaging (MRI) or computed tomography (CT). TREATMENT  Your caregiver may recommend movements or procedures to correct the benign positional vertigo. Medicines such as meclizine, benzodiazepines, and medicines for nausea may be used to treat your symptoms. In rare cases, if your symptoms are caused by certain conditions that affect the inner ear, you may need surgery. HOME CARE INSTRUCTIONS   Follow your caregiver's instructions.  Move slowly. Do not make sudden body or head movements.  Avoid driving.  Avoid operating heavy machinery.  Avoid performing any tasks that would be dangerous to you or others during a vertigo episode.  Drink enough fluids to keep your urine clear or pale yellow. SEEK IMMEDIATE MEDICAL CARE IF:   You develop problems with walking, weakness, numbness, or using your arms, hands, or legs.  You have difficulty speaking.  You develop severe headaches.  Your nausea or vomiting continues or gets worse.  You develop visual changes.  Your family or friends notice any behavioral changes.  Your condition gets worse.  You have a fever.  You develop a stiff neck or sensitivity to light. MAKE SURE YOU:    Understand these instructions.  Will watch your condition.  Will get help right away if you are not doing well or get worse. Document Released: 12/21/2005 Document Revised: 06/07/2011 Document Reviewed: 12/03/2010 ExitCare Patient Information 2015 [employer]. This information is not intended to replace advice given to you by your health care provider. Make sure you discuss any questions you have with your health care provider.  

## 2015-07-04 ENCOUNTER — Ambulatory Visit (INDEPENDENT_AMBULATORY_CARE_PROVIDER_SITE_OTHER): Payer: BC Managed Care – PPO

## 2015-07-04 ENCOUNTER — Encounter (HOSPITAL_COMMUNITY): Payer: Self-pay | Admitting: Emergency Medicine

## 2015-07-04 ENCOUNTER — Ambulatory Visit (HOSPITAL_COMMUNITY)
Admission: EM | Admit: 2015-07-04 | Discharge: 2015-07-04 | Disposition: A | Discharge status: Home / Self Care | Payer: BC Managed Care – PPO | Attending: Emergency Medicine | Admitting: Emergency Medicine

## 2015-07-04 DIAGNOSIS — S42251A Displaced fracture of greater tuberosity of right humerus, initial encounter for closed fracture: Secondary | ICD-10-CM

## 2015-07-04 MED ORDER — TRAMADOL HCL 50 MG PO TABS
50.0000 mg | ORAL_TABLET | Freq: Four times a day (QID) | ORAL | Status: AC | PRN
Start: 2015-07-04 — End: ?

## 2015-07-04 MED ORDER — ACETAMINOPHEN 325 MG PO TABS
ORAL_TABLET | ORAL | Status: AC
  Filled 2015-07-04: qty 3

## 2015-07-04 MED ORDER — ACETAMINOPHEN 325 MG PO TABS
975.0000 mg | ORAL_TABLET | Freq: Once | ORAL | Status: AC
  Administered 2015-07-04: 975 mg via ORAL

## 2015-07-04 NOTE — ED Notes (Signed)
Right shoulder pain after a fall last night.  Tripped over a rug in the kitchen.  Patient reports that on her way to the floor, right arm got caught on the counter top which pulled arm upward as she fell to the floor.  Right radial pulse 2 plus, able to move fingers.  Not able to lift arm from her side.  Able to move fingers.  Patient has some numbness in index and middle finger.

## 2015-07-04 NOTE — Discharge Instructions (Signed)
You have broken part of your humerus. This is a very stable fracture. Wear the sling. It is okay to remove the sling to shower. Apply ice several times a day. Use the tramadol every 6-8 hours as needed for pain. Follow-up with Dr. Darrelyn HillockGioffre next Tuesday.

## 2015-07-04 NOTE — ED Provider Notes (Signed)
CSN: [id number]     Arrival date & time 07/04/15  1412 History   First MD Initiated Contact with Patient 07/04/15 1505     Chief Complaint  Patient presents with  . Shoulder Pain   (Consider location/radiation/quality/duration/timing/severity/associated sxs/prior Treatment) HPI  She is a 60 year old woman here for evaluation of right shoulder pain. She states she tripped on her rug last night. As she was falling her right arm got caught on her kitchen counter causing her arm to be forcefully abducted and extended. She denies any pain in her elbow or wrist. No radicular pain. The pain is mostly in her anterior shoulder and will go into her lateral upper arm. It is worse with any sort of movement of the shoulder. She is unable to move the shoulder independently due to the pain. She denies any bruising or swelling. She has been applying ice. She has not tried any medications. She reports feeling nauseated from the pain. No prior injury to that shoulder.  Past Medical History  Diagnosis Date  . Diabetes mellitus   . COPD (chronic obstructive pulmonary disease) (HCC)   . Irritable bowel syndrome (IBS)   . Interstitial cystitis   . Kidney stones    Past Surgical History  Procedure Laterality Date  . Abdominal hysterectomy    . Knee surgery    . Foot surgery     No family history on file. Social History  Substance Use Topics  . Smoking status: Never Smoker   . Smokeless tobacco: None  . Alcohol Use: No   OB History    No data available     Review of Systems As in history of present illness Allergies  Amoxicillin-pot clavulanate; Aspirin; and Levofloxacin  Home Medications   Prior to Admission medications   Medication Sig Start Date End Date Taking? Authorizing Provider  Albuterol (VENTOLIN IN) Inhale into the lungs.    Historical Provider, MD  busPIRone (BUSPAR) 15 MG tablet Take 15 mg by mouth 2 (two) times daily.    Historical Provider, MD  cetirizine (ZYRTEC) 10 MG tablet  Take 10 mg by mouth daily.    Historical Provider, MD  fluticasone (FLONASE) 50 MCG/ACT nasal spray Place 2 sprays into the nose daily.    Historical Provider, MD  imipramine (TOFRANIL) 25 MG tablet Take 25 mg by mouth at bedtime.    Historical Provider, MD  Liraglutide (VICTOZA) 18 MG/3ML SOLN Inject into the skin.    Historical Provider, MD  meclizine (ANTIVERT) 25 MG tablet Take 1 tablet (25 mg total) by mouth every 6 (six) hours as needed for dizziness or nausea. 07/05/14   Graylon Good, PA-C  metFORMIN (GLUCOPHAGE) 1000 MG tablet Take 1,000 mg by mouth 2 (two) times daily with a meal.    Historical Provider, MD  montelukast (SINGULAIR) 10 MG tablet Take 10 mg by mouth at bedtime.    Historical Provider, MD  pantoprazole (PROTONIX) 40 MG tablet Take 40 mg by mouth daily.    Historical Provider, MD  PARoxetine (PAXIL) 20 MG tablet Take 20 mg by mouth daily.    Historical Provider, MD  pentosan polysulfate (ELMIRON) 100 MG capsule Take 100 mg by mouth 3 (three) times daily before meals.    Historical Provider, MD  predniSONE (DELTASONE) 20 MG tablet Take 2 tablets (40 mg total) by mouth daily with breakfast. 07/05/14   Graylon Good, PA-C  telmisartan-hydrochlorothiazide (MICARDIS HCT) 40-12.5 MG per tablet Take 1 tablet by mouth daily.    Historical  Provider, MD  tiotropium (SPIRIVA) 18 MCG inhalation capsule Place 18 mcg into inhaler and inhale daily.    Historical Provider, MD  traMADol (ULTRAM) 50 MG tablet Take 1 tablet (50 mg total) by mouth every 6 (six) hours as needed. 07/04/15   Charm RingsErin J Honig, MD   Meds Ordered and Administered this Visit   Medications  acetaminophen (TYLENOL) tablet 975 mg (975 mg Oral Given 07/04/15 1629)    BP 140/81 mmHg  Pulse 71  Temp(Src) 98.3 F (36.8 C) (Oral)  SpO2 97% No data found.   Physical Exam  Constitutional: She is oriented to person, place, and time. She appears well-developed and well-nourished. No distress.  Neck: Neck supple.   Cardiovascular: Normal rate.   Pulmonary/Chest: Effort normal.  Musculoskeletal:  Right shoulder: No obvious deformity or asymmetry compared to the left. No swelling, erythema, or bruising. No bony tenderness or point tenderness. Active range of motion is extremely limited due to pain. I can abduct her arm about 45 before she complains of pain. 2+ radial pulse. Right elbow: No tenderness. Full active range of motion without pain.  Neurological: She is alert and oriented to person, place, and time.    ED Course  Procedures (including critical care time)  Labs Review Labs Reviewed - No data to display  Imaging Review Dg Shoulder Right  07/04/2015  CLINICAL DATA:  Status post trip and fall last night with a blow to the right shoulder on a kitchen encounter. Pain. Initial encounter. EXAM: RIGHT SHOULDER - 2+ VIEW COMPARISON:  None. FINDINGS: The patient has an acute fracture of the greater tuberosity which shows minimal inferior displacement and impaction. No other acute bony or joint abnormality is identified. Imaged right lung and ribs appear normal. IMPRESSION: Acute minimally displaced and impacted greater tuberosity fracture right humerus. Electronically Signed   By: Drusilla Kannerhomas  Dalessio M.D.   On: 07/04/2015 16:01     MDM   1. Greater tuberosity of humerus fracture, right, closed, initial encounter    Spoke with Dr. Darrelyn HillockGioffre who will see the patient next week. Patient placed in sling. Tramadol as needed for pain.    Charm RingsErin J Honig, MD 07/04/15 [phone]

## 2016-08-25 ENCOUNTER — Other Ambulatory Visit: Payer: Self-pay | Admitting: Occupational Medicine

## 2016-08-25 ENCOUNTER — Ambulatory Visit: Payer: Self-pay

## 2016-08-25 DIAGNOSIS — M25561 Pain in right knee: Secondary | ICD-10-CM

## 2016-08-27 ENCOUNTER — Other Ambulatory Visit: Payer: Self-pay | Admitting: Family Medicine

## 2016-08-27 ENCOUNTER — Other Ambulatory Visit (HOSPITAL_COMMUNITY)
Admission: RE | Admit: 2016-08-27 | Discharge: 2016-08-27 | Disposition: A | Discharge status: Home / Self Care | Payer: BC Managed Care – PPO | Source: Ambulatory Visit | Attending: Family Medicine | Admitting: Family Medicine

## 2016-08-27 DIAGNOSIS — R87810 Cervical high risk human papillomavirus (HPV) DNA test positive: Secondary | ICD-10-CM | POA: Diagnosis not present

## 2016-08-27 DIAGNOSIS — Z124 Encounter for screening for malignant neoplasm of cervix: Secondary | ICD-10-CM | POA: Insufficient documentation

## 2016-09-02 LAB — CYTOLOGY - PAP
HPV 16/18/45 GENOTYPING: POSITIVE — AB
HPV: DETECTED — AB

## 2016-10-05 ENCOUNTER — Other Ambulatory Visit: Payer: Self-pay | Admitting: Family Medicine

## 2016-10-05 DIAGNOSIS — Z1231 Encounter for screening mammogram for malignant neoplasm of breast: Secondary | ICD-10-CM

## 2016-10-07 ENCOUNTER — Ambulatory Visit
Admission: RE | Admit: 2016-10-07 | Discharge: 2016-10-07 | Disposition: A | Discharge status: Home / Self Care | Payer: BC Managed Care – PPO | Source: Ambulatory Visit | Attending: Family Medicine | Admitting: Family Medicine

## 2016-10-07 DIAGNOSIS — Z1231 Encounter for screening mammogram for malignant neoplasm of breast: Secondary | ICD-10-CM

## 2016-10-29 ENCOUNTER — Other Ambulatory Visit: Payer: Self-pay | Admitting: Obstetrics & Gynecology

## 2017-11-01 ENCOUNTER — Other Ambulatory Visit: Payer: Self-pay | Admitting: Obstetrics & Gynecology

## 2019-02-06 ENCOUNTER — Other Ambulatory Visit: Payer: Self-pay | Admitting: Obstetrics & Gynecology

## 2019-02-06 DIAGNOSIS — Z1231 Encounter for screening mammogram for malignant neoplasm of breast: Secondary | ICD-10-CM

## 2019-04-02 ENCOUNTER — Ambulatory Visit: Payer: BC Managed Care – PPO

## 2019-04-11 ENCOUNTER — Other Ambulatory Visit: Payer: Self-pay | Admitting: Obstetrics & Gynecology

## 2019-05-10 ENCOUNTER — Other Ambulatory Visit: Payer: Self-pay

## 2019-05-10 ENCOUNTER — Ambulatory Visit
Admission: RE | Admit: 2019-05-10 | Discharge: 2019-05-10 | Disposition: A | Discharge status: Home / Self Care | Payer: BC Managed Care – PPO | Source: Ambulatory Visit | Attending: Obstetrics & Gynecology | Admitting: Obstetrics & Gynecology

## 2019-05-10 DIAGNOSIS — Z1231 Encounter for screening mammogram for malignant neoplasm of breast: Secondary | ICD-10-CM

## 2020-03-12 ENCOUNTER — Other Ambulatory Visit: Payer: Self-pay | Admitting: Family Medicine

## 2020-03-12 ENCOUNTER — Other Ambulatory Visit: Payer: Self-pay

## 2020-03-12 ENCOUNTER — Ambulatory Visit
Admission: RE | Admit: 2020-03-12 | Discharge: 2020-03-12 | Disposition: A | Discharge status: Home / Self Care | Payer: BC Managed Care – PPO | Source: Ambulatory Visit | Attending: Family Medicine | Admitting: Family Medicine

## 2020-03-12 DIAGNOSIS — M79671 Pain in right foot: Secondary | ICD-10-CM

## 2020-04-09 ENCOUNTER — Ambulatory Visit: Payer: BC Managed Care – PPO | Admitting: Podiatry

## 2020-04-09 ENCOUNTER — Other Ambulatory Visit: Payer: Self-pay

## 2020-04-09 DIAGNOSIS — M7671 Peroneal tendinitis, right leg: Secondary | ICD-10-CM

## 2020-04-09 MED ORDER — METHYLPREDNISOLONE 4 MG PO TBPK: ORAL_TABLET | ORAL | 0 refills | Status: AC

## 2020-04-09 NOTE — Progress Notes (Signed)
   HPI: 65 y.o. female presenting today as a new patient for evaluation of pain to the lateral aspect of the right foot.  Patient states that she has had pain and tenderness to the lateral aspect of the right foot for approximately 2 months now.  Her PCP prescribed meloxicam that she has been taking daily.  She also has tried shoe gear modifications with insoles with minimal relief.  She had x-rays taken on 03/12/2020.  She presents for further treatment and evaluation  Past Medical History:  Diagnosis Date  . COPD (chronic obstructive pulmonary disease) (HCC)   . Diabetes mellitus   . Interstitial cystitis   . Irritable bowel syndrome (IBS)   . Kidney stones      Physical Exam: General: The patient is alert and oriented x3 in no acute distress.  Dermatology: Skin is warm, dry and supple bilateral lower extremities. Negative for open lesions or macerations.  Vascular: Palpable pedal pulses bilaterally. No edema or erythema noted. Capillary refill within normal limits.  Neurological: Epicritic and protective threshold grossly intact bilaterally.   Musculoskeletal Exam: Range of motion within normal limits to all pedal and ankle joints bilateral. Muscle strength 5/5 in all groups bilateral.  There is pain on palpation along the peroneal tendon as it inserts onto the fifth metatarsal tubercle.  Radiographic Exam taken at [facility] imaging 03/12/2020:  Small calcaneal spurs are noted. Tarsal degenerative changes are seen. No acute fracture or dislocation is noted. No soft tissue abnormality is seen.  IMPRESSION: Chronic changes without acute abnormality.   Assessment: 1.  Insertional peroneal tendinitis left   Plan of Care:  1. Patient evaluated. X-Rays reviewed.  2.  Injection of 0.5 cc Celestone Soluspan injected along the peroneal tendon sheath left 3.  Prescription for Medrol Dosepak.  Continue meloxicam after completion of the Dosepak 4.  Cam boot dispensed.  Weightbearing  as tolerated x4 weeks 5.  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 Felecia Shelling, DPM    [address] [place], [place] [address]                Office [phone]  Fax [phone]

## 2020-05-05 ENCOUNTER — Other Ambulatory Visit: Payer: Self-pay

## 2020-05-05 ENCOUNTER — Ambulatory Visit: Payer: BC Managed Care – PPO | Admitting: Podiatry

## 2020-05-05 DIAGNOSIS — M7671 Peroneal tendinitis, right leg: Secondary | ICD-10-CM

## 2020-05-05 NOTE — Progress Notes (Signed)
   HPI: 65 y.o. female presenting today for follow-up evaluation of insertional peroneal tendinitis to the right lower extremity.  Patient states she is doing very well.  She has no pain or tenderness.  She took the prednisone pack as prescribed and is currently walking in tennis shoes with no pain.  She has been back at work full activity with no restrictions.  Past Medical History:  Diagnosis Date  . COPD (chronic obstructive pulmonary disease) (HCC)   . Diabetes mellitus   . Interstitial cystitis   . Irritable bowel syndrome (IBS)   . Kidney stones      Physical Exam: General: The patient is alert and oriented x3 in no acute distress.  Dermatology: Skin is warm, dry and supple bilateral lower extremities. Negative for open lesions or macerations.  Vascular: Palpable pedal pulses bilaterally. No edema or erythema noted. Capillary refill within normal limits.  Neurological: Epicritic and protective threshold grossly intact bilaterally.   Musculoskeletal Exam: Range of motion within normal limits to all pedal and ankle joints bilateral. Muscle strength 5/5 in all groups bilateral.  There is no pain on palpation along the peroneal tendon as it inserts onto the fifth metatarsal tubercle.  Radiographic Exam taken at [facility] imaging 03/12/2020:  Small calcaneal spurs are noted. Tarsal degenerative changes are seen. No acute fracture or dislocation is noted. No soft tissue abnormality is seen.  IMPRESSION: Chronic changes without acute abnormality.   Assessment: 1.  Insertional peroneal tendinitis left   Plan of Care:  1. Patient evaluated. Overall the patient is doing significantly better.  She has no pain or tenderness.  She may resume full activity no restrictions 2.  Recommend good shoes from [employer] running store 3.  Patient may discontinue the cam boot. 4.  Continue meloxicam as needed 5.  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 Felecia Shelling, DPM    [address] [place], [place] [address]                Office [phone]  Fax [phone]

## 2021-11-26 ENCOUNTER — Other Ambulatory Visit: Payer: Self-pay | Admitting: Pediatrics

## 2021-11-26 DIAGNOSIS — M8580 Other specified disorders of bone density and structure, unspecified site: Secondary | ICD-10-CM

## 2022-01-07 ENCOUNTER — Other Ambulatory Visit: Payer: Self-pay | Admitting: Pediatrics

## 2022-01-07 DIAGNOSIS — Z1231 Encounter for screening mammogram for malignant neoplasm of breast: Secondary | ICD-10-CM

## 2022-02-11 ENCOUNTER — Ambulatory Visit
Admission: RE | Admit: 2022-02-11 | Discharge: 2022-02-11 | Disposition: A | Discharge status: Home / Self Care | Payer: BC Managed Care – PPO | Source: Ambulatory Visit | Attending: Pediatrics | Admitting: Pediatrics

## 2022-02-11 DIAGNOSIS — Z1231 Encounter for screening mammogram for malignant neoplasm of breast: Secondary | ICD-10-CM

## 2022-04-09 DIAGNOSIS — F17201 Nicotine dependence, unspecified, in remission: Secondary | ICD-10-CM | POA: Diagnosis not present

## 2022-04-09 DIAGNOSIS — J3089 Other allergic rhinitis: Secondary | ICD-10-CM | POA: Diagnosis not present

## 2022-04-09 DIAGNOSIS — N3010 Interstitial cystitis (chronic) without hematuria: Secondary | ICD-10-CM | POA: Diagnosis not present

## 2022-04-09 DIAGNOSIS — F411 Generalized anxiety disorder: Secondary | ICD-10-CM | POA: Diagnosis not present

## 2022-04-09 DIAGNOSIS — J45909 Unspecified asthma, uncomplicated: Secondary | ICD-10-CM | POA: Diagnosis not present

## 2022-04-09 DIAGNOSIS — E1165 Type 2 diabetes mellitus with hyperglycemia: Secondary | ICD-10-CM | POA: Diagnosis not present

## 2022-04-09 DIAGNOSIS — J449 Chronic obstructive pulmonary disease, unspecified: Secondary | ICD-10-CM | POA: Diagnosis not present

## 2022-04-09 DIAGNOSIS — K219 Gastro-esophageal reflux disease without esophagitis: Secondary | ICD-10-CM | POA: Diagnosis not present

## 2022-04-09 DIAGNOSIS — Z0000 Encounter for general adult medical examination without abnormal findings: Secondary | ICD-10-CM | POA: Diagnosis not present

## 2022-04-09 DIAGNOSIS — N3281 Overactive bladder: Secondary | ICD-10-CM | POA: Diagnosis not present

## 2022-04-09 DIAGNOSIS — F329 Major depressive disorder, single episode, unspecified: Secondary | ICD-10-CM | POA: Diagnosis not present

## 2022-04-09 DIAGNOSIS — Z794 Long term (current) use of insulin: Secondary | ICD-10-CM | POA: Diagnosis not present

## 2022-05-05 ENCOUNTER — Other Ambulatory Visit: Payer: Self-pay | Admitting: Obstetrics and Gynecology

## 2022-05-05 DIAGNOSIS — M8580 Other specified disorders of bone density and structure, unspecified site: Secondary | ICD-10-CM

## 2022-05-05 DIAGNOSIS — D122 Benign neoplasm of ascending colon: Secondary | ICD-10-CM | POA: Diagnosis not present

## 2022-05-05 DIAGNOSIS — K648 Other hemorrhoids: Secondary | ICD-10-CM | POA: Diagnosis not present

## 2022-05-05 DIAGNOSIS — D123 Benign neoplasm of transverse colon: Secondary | ICD-10-CM | POA: Diagnosis not present

## 2022-05-05 DIAGNOSIS — D120 Benign neoplasm of cecum: Secondary | ICD-10-CM | POA: Diagnosis not present

## 2022-05-05 DIAGNOSIS — Z1211 Encounter for screening for malignant neoplasm of colon: Secondary | ICD-10-CM | POA: Diagnosis not present

## 2022-05-07 ENCOUNTER — Ambulatory Visit
Admission: RE | Admit: 2022-05-07 | Discharge: 2022-05-07 | Disposition: A | Discharge status: Home / Self Care | Payer: PPO | Source: Ambulatory Visit | Attending: Pediatrics | Admitting: Pediatrics

## 2022-05-07 DIAGNOSIS — M8580 Other specified disorders of bone density and structure, unspecified site: Secondary | ICD-10-CM

## 2022-05-07 DIAGNOSIS — Z780 Asymptomatic menopausal state: Secondary | ICD-10-CM | POA: Diagnosis not present

## 2022-05-07 DIAGNOSIS — M8589 Other specified disorders of bone density and structure, multiple sites: Secondary | ICD-10-CM | POA: Diagnosis not present

## 2022-05-07 DIAGNOSIS — D123 Benign neoplasm of transverse colon: Secondary | ICD-10-CM | POA: Diagnosis not present

## 2022-05-11 DIAGNOSIS — N890 Mild vaginal dysplasia: Secondary | ICD-10-CM | POA: Diagnosis not present

## 2022-05-11 DIAGNOSIS — N900 Mild vulvar dysplasia: Secondary | ICD-10-CM | POA: Diagnosis not present

## 2022-08-02 DIAGNOSIS — E782 Mixed hyperlipidemia: Secondary | ICD-10-CM | POA: Diagnosis not present

## 2022-08-02 DIAGNOSIS — E1165 Type 2 diabetes mellitus with hyperglycemia: Secondary | ICD-10-CM | POA: Diagnosis not present

## 2022-08-09 DIAGNOSIS — E782 Mixed hyperlipidemia: Secondary | ICD-10-CM | POA: Diagnosis not present

## 2022-08-09 DIAGNOSIS — R809 Proteinuria, unspecified: Secondary | ICD-10-CM | POA: Diagnosis not present

## 2022-08-09 DIAGNOSIS — E1165 Type 2 diabetes mellitus with hyperglycemia: Secondary | ICD-10-CM | POA: Diagnosis not present

## 2022-08-09 DIAGNOSIS — I10 Essential (primary) hypertension: Secondary | ICD-10-CM | POA: Diagnosis not present

## 2022-08-09 DIAGNOSIS — E1169 Type 2 diabetes mellitus with other specified complication: Secondary | ICD-10-CM | POA: Diagnosis not present

## 2022-08-12 DIAGNOSIS — E1165 Type 2 diabetes mellitus with hyperglycemia: Secondary | ICD-10-CM | POA: Diagnosis not present

## 2022-09-12 DIAGNOSIS — E1165 Type 2 diabetes mellitus with hyperglycemia: Secondary | ICD-10-CM | POA: Diagnosis not present

## 2022-10-08 DIAGNOSIS — J45909 Unspecified asthma, uncomplicated: Secondary | ICD-10-CM | POA: Diagnosis not present

## 2022-10-08 DIAGNOSIS — K219 Gastro-esophageal reflux disease without esophagitis: Secondary | ICD-10-CM | POA: Diagnosis not present

## 2022-10-08 DIAGNOSIS — E1165 Type 2 diabetes mellitus with hyperglycemia: Secondary | ICD-10-CM | POA: Diagnosis not present

## 2022-10-08 DIAGNOSIS — J3089 Other allergic rhinitis: Secondary | ICD-10-CM | POA: Diagnosis not present

## 2022-10-08 DIAGNOSIS — F329 Major depressive disorder, single episode, unspecified: Secondary | ICD-10-CM | POA: Diagnosis not present

## 2022-10-08 DIAGNOSIS — F419 Anxiety disorder, unspecified: Secondary | ICD-10-CM | POA: Diagnosis not present

## 2022-10-08 DIAGNOSIS — R21 Rash and other nonspecific skin eruption: Secondary | ICD-10-CM | POA: Diagnosis not present

## 2022-10-08 DIAGNOSIS — J449 Chronic obstructive pulmonary disease, unspecified: Secondary | ICD-10-CM | POA: Diagnosis not present

## 2022-10-08 DIAGNOSIS — Z794 Long term (current) use of insulin: Secondary | ICD-10-CM | POA: Diagnosis not present

## 2022-10-08 DIAGNOSIS — N3010 Interstitial cystitis (chronic) without hematuria: Secondary | ICD-10-CM | POA: Diagnosis not present

## 2022-10-08 DIAGNOSIS — N3281 Overactive bladder: Secondary | ICD-10-CM | POA: Diagnosis not present

## 2022-10-08 DIAGNOSIS — F17201 Nicotine dependence, unspecified, in remission: Secondary | ICD-10-CM | POA: Diagnosis not present

## 2022-10-12 DIAGNOSIS — E1165 Type 2 diabetes mellitus with hyperglycemia: Secondary | ICD-10-CM | POA: Diagnosis not present

## 2022-10-21 DIAGNOSIS — S50362A Insect bite (nonvenomous) of left elbow, initial encounter: Secondary | ICD-10-CM | POA: Diagnosis not present

## 2022-10-21 DIAGNOSIS — W57XXXD Bitten or stung by nonvenomous insect and other nonvenomous arthropods, subsequent encounter: Secondary | ICD-10-CM | POA: Diagnosis not present

## 2022-10-25 DIAGNOSIS — E139 Other specified diabetes mellitus without complications: Secondary | ICD-10-CM | POA: Diagnosis not present

## 2022-10-25 DIAGNOSIS — H524 Presbyopia: Secondary | ICD-10-CM | POA: Diagnosis not present

## 2022-10-25 DIAGNOSIS — H5203 Hypermetropia, bilateral: Secondary | ICD-10-CM | POA: Diagnosis not present

## 2022-10-25 DIAGNOSIS — H52223 Regular astigmatism, bilateral: Secondary | ICD-10-CM | POA: Diagnosis not present

## 2022-10-25 DIAGNOSIS — H25813 Combined forms of age-related cataract, bilateral: Secondary | ICD-10-CM | POA: Diagnosis not present

## 2022-11-12 DIAGNOSIS — Z90710 Acquired absence of both cervix and uterus: Secondary | ICD-10-CM | POA: Diagnosis not present

## 2022-11-12 DIAGNOSIS — R300 Dysuria: Secondary | ICD-10-CM | POA: Diagnosis not present

## 2022-11-12 DIAGNOSIS — N900 Mild vulvar dysplasia: Secondary | ICD-10-CM | POA: Diagnosis not present

## 2022-11-12 DIAGNOSIS — N898 Other specified noninflammatory disorders of vagina: Secondary | ICD-10-CM | POA: Diagnosis not present

## 2022-11-12 DIAGNOSIS — N952 Postmenopausal atrophic vaginitis: Secondary | ICD-10-CM | POA: Diagnosis not present

## 2022-11-12 DIAGNOSIS — N9089 Other specified noninflammatory disorders of vulva and perineum: Secondary | ICD-10-CM | POA: Diagnosis not present

## 2022-11-12 DIAGNOSIS — E1165 Type 2 diabetes mellitus with hyperglycemia: Secondary | ICD-10-CM | POA: Diagnosis not present

## 2022-11-24 DIAGNOSIS — S50362D Insect bite (nonvenomous) of left elbow, subsequent encounter: Secondary | ICD-10-CM | POA: Diagnosis not present

## 2022-11-24 DIAGNOSIS — W57XXXD Bitten or stung by nonvenomous insect and other nonvenomous arthropods, subsequent encounter: Secondary | ICD-10-CM | POA: Diagnosis not present

## 2022-11-24 DIAGNOSIS — L98499 Non-pressure chronic ulcer of skin of other sites with unspecified severity: Secondary | ICD-10-CM | POA: Diagnosis not present

## 2022-11-24 DIAGNOSIS — B3731 Acute candidiasis of vulva and vagina: Secondary | ICD-10-CM | POA: Diagnosis not present

## 2022-11-30 DIAGNOSIS — Z01419 Encounter for gynecological examination (general) (routine) without abnormal findings: Secondary | ICD-10-CM | POA: Diagnosis not present

## 2022-11-30 DIAGNOSIS — N890 Mild vaginal dysplasia: Secondary | ICD-10-CM | POA: Diagnosis not present

## 2022-11-30 DIAGNOSIS — N900 Mild vulvar dysplasia: Secondary | ICD-10-CM | POA: Diagnosis not present

## 2022-12-07 DIAGNOSIS — E1165 Type 2 diabetes mellitus with hyperglycemia: Secondary | ICD-10-CM | POA: Diagnosis not present

## 2022-12-13 DIAGNOSIS — E1165 Type 2 diabetes mellitus with hyperglycemia: Secondary | ICD-10-CM | POA: Diagnosis not present

## 2022-12-14 DIAGNOSIS — Z794 Long term (current) use of insulin: Secondary | ICD-10-CM | POA: Diagnosis not present

## 2022-12-14 DIAGNOSIS — Z23 Encounter for immunization: Secondary | ICD-10-CM | POA: Diagnosis not present

## 2022-12-14 DIAGNOSIS — E1165 Type 2 diabetes mellitus with hyperglycemia: Secondary | ICD-10-CM | POA: Diagnosis not present

## 2022-12-14 DIAGNOSIS — I10 Essential (primary) hypertension: Secondary | ICD-10-CM | POA: Diagnosis not present

## 2022-12-14 DIAGNOSIS — E1169 Type 2 diabetes mellitus with other specified complication: Secondary | ICD-10-CM | POA: Diagnosis not present

## 2023-01-03 ENCOUNTER — Other Ambulatory Visit: Payer: Self-pay | Admitting: Obstetrics and Gynecology

## 2023-01-03 DIAGNOSIS — Z1239 Encounter for other screening for malignant neoplasm of breast: Secondary | ICD-10-CM

## 2023-01-12 DIAGNOSIS — E1165 Type 2 diabetes mellitus with hyperglycemia: Secondary | ICD-10-CM | POA: Diagnosis not present

## 2023-02-12 DIAGNOSIS — E1165 Type 2 diabetes mellitus with hyperglycemia: Secondary | ICD-10-CM | POA: Diagnosis not present

## 2023-02-14 ENCOUNTER — Ambulatory Visit
Admission: RE | Admit: 2023-02-14 | Discharge: 2023-02-14 | Disposition: A | Discharge status: Home / Self Care | Payer: PPO | Source: Ambulatory Visit | Attending: Obstetrics and Gynecology | Admitting: Obstetrics and Gynecology

## 2023-02-14 DIAGNOSIS — Z1239 Encounter for other screening for malignant neoplasm of breast: Secondary | ICD-10-CM

## 2023-02-14 DIAGNOSIS — Z1231 Encounter for screening mammogram for malignant neoplasm of breast: Secondary | ICD-10-CM | POA: Diagnosis not present

## 2023-03-08 DIAGNOSIS — E1165 Type 2 diabetes mellitus with hyperglycemia: Secondary | ICD-10-CM | POA: Diagnosis not present

## 2023-03-14 DIAGNOSIS — E1165 Type 2 diabetes mellitus with hyperglycemia: Secondary | ICD-10-CM | POA: Diagnosis not present

## 2023-03-15 DIAGNOSIS — E782 Mixed hyperlipidemia: Secondary | ICD-10-CM | POA: Diagnosis not present

## 2023-03-15 DIAGNOSIS — Z794 Long term (current) use of insulin: Secondary | ICD-10-CM | POA: Diagnosis not present

## 2023-03-15 DIAGNOSIS — E1165 Type 2 diabetes mellitus with hyperglycemia: Secondary | ICD-10-CM | POA: Diagnosis not present

## 2023-03-15 DIAGNOSIS — I10 Essential (primary) hypertension: Secondary | ICD-10-CM | POA: Diagnosis not present

## 2023-03-15 DIAGNOSIS — E1169 Type 2 diabetes mellitus with other specified complication: Secondary | ICD-10-CM | POA: Diagnosis not present

## 2023-03-24 DIAGNOSIS — J0110 Acute frontal sinusitis, unspecified: Secondary | ICD-10-CM | POA: Diagnosis not present

## 2023-03-24 DIAGNOSIS — E1169 Type 2 diabetes mellitus with other specified complication: Secondary | ICD-10-CM | POA: Diagnosis not present

## 2023-03-24 DIAGNOSIS — Z794 Long term (current) use of insulin: Secondary | ICD-10-CM | POA: Diagnosis not present

## 2023-03-24 DIAGNOSIS — H6123 Impacted cerumen, bilateral: Secondary | ICD-10-CM | POA: Diagnosis not present

## 2023-03-24 DIAGNOSIS — J449 Chronic obstructive pulmonary disease, unspecified: Secondary | ICD-10-CM | POA: Diagnosis not present

## 2023-05-27 DIAGNOSIS — M25562 Pain in left knee: Secondary | ICD-10-CM | POA: Diagnosis not present

## 2023-06-12 DIAGNOSIS — E1165 Type 2 diabetes mellitus with hyperglycemia: Secondary | ICD-10-CM | POA: Diagnosis not present

## 2023-06-13 DIAGNOSIS — M25512 Pain in left shoulder: Secondary | ICD-10-CM | POA: Diagnosis not present

## 2023-06-13 DIAGNOSIS — E1165 Type 2 diabetes mellitus with hyperglycemia: Secondary | ICD-10-CM | POA: Diagnosis not present

## 2023-06-13 DIAGNOSIS — S83002A Unspecified subluxation of left patella, initial encounter: Secondary | ICD-10-CM | POA: Diagnosis not present

## 2023-06-20 DIAGNOSIS — E1169 Type 2 diabetes mellitus with other specified complication: Secondary | ICD-10-CM | POA: Diagnosis not present

## 2023-06-20 DIAGNOSIS — E782 Mixed hyperlipidemia: Secondary | ICD-10-CM | POA: Diagnosis not present

## 2023-06-20 DIAGNOSIS — I10 Essential (primary) hypertension: Secondary | ICD-10-CM | POA: Diagnosis not present

## 2023-06-20 DIAGNOSIS — E1165 Type 2 diabetes mellitus with hyperglycemia: Secondary | ICD-10-CM | POA: Diagnosis not present

## 2023-06-22 DIAGNOSIS — M7542 Impingement syndrome of left shoulder: Secondary | ICD-10-CM | POA: Diagnosis not present

## 2023-06-27 DIAGNOSIS — M7542 Impingement syndrome of left shoulder: Secondary | ICD-10-CM | POA: Diagnosis not present

## 2023-06-30 DIAGNOSIS — M7542 Impingement syndrome of left shoulder: Secondary | ICD-10-CM | POA: Diagnosis not present

## 2023-07-07 DIAGNOSIS — M7542 Impingement syndrome of left shoulder: Secondary | ICD-10-CM | POA: Diagnosis not present

## 2023-07-11 DIAGNOSIS — M7542 Impingement syndrome of left shoulder: Secondary | ICD-10-CM | POA: Diagnosis not present

## 2023-07-13 DIAGNOSIS — E1165 Type 2 diabetes mellitus with hyperglycemia: Secondary | ICD-10-CM | POA: Diagnosis not present

## 2023-07-13 DIAGNOSIS — M7542 Impingement syndrome of left shoulder: Secondary | ICD-10-CM | POA: Diagnosis not present

## 2023-07-21 DIAGNOSIS — M7542 Impingement syndrome of left shoulder: Secondary | ICD-10-CM | POA: Diagnosis not present

## 2023-07-26 DIAGNOSIS — M7542 Impingement syndrome of left shoulder: Secondary | ICD-10-CM | POA: Diagnosis not present

## 2023-08-02 DIAGNOSIS — M25512 Pain in left shoulder: Secondary | ICD-10-CM | POA: Diagnosis not present

## 2023-08-02 DIAGNOSIS — M25562 Pain in left knee: Secondary | ICD-10-CM | POA: Diagnosis not present

## 2023-08-03 DIAGNOSIS — M7542 Impingement syndrome of left shoulder: Secondary | ICD-10-CM | POA: Diagnosis not present

## 2023-08-10 DIAGNOSIS — M7542 Impingement syndrome of left shoulder: Secondary | ICD-10-CM | POA: Diagnosis not present

## 2023-08-12 DIAGNOSIS — E1165 Type 2 diabetes mellitus with hyperglycemia: Secondary | ICD-10-CM | POA: Diagnosis not present

## 2023-08-16 DIAGNOSIS — M7542 Impingement syndrome of left shoulder: Secondary | ICD-10-CM | POA: Diagnosis not present

## 2023-08-24 DIAGNOSIS — M7542 Impingement syndrome of left shoulder: Secondary | ICD-10-CM | POA: Diagnosis not present

## 2023-09-08 DIAGNOSIS — M25512 Pain in left shoulder: Secondary | ICD-10-CM | POA: Diagnosis not present

## 2023-09-12 DIAGNOSIS — E1165 Type 2 diabetes mellitus with hyperglycemia: Secondary | ICD-10-CM | POA: Diagnosis not present

## 2023-10-12 DIAGNOSIS — E1165 Type 2 diabetes mellitus with hyperglycemia: Secondary | ICD-10-CM | POA: Diagnosis not present

## 2023-10-20 DIAGNOSIS — E1165 Type 2 diabetes mellitus with hyperglycemia: Secondary | ICD-10-CM | POA: Diagnosis not present

## 2023-10-25 DIAGNOSIS — N3010 Interstitial cystitis (chronic) without hematuria: Secondary | ICD-10-CM | POA: Diagnosis not present

## 2023-10-25 DIAGNOSIS — Z794 Long term (current) use of insulin: Secondary | ICD-10-CM | POA: Diagnosis not present

## 2023-10-25 DIAGNOSIS — E1165 Type 2 diabetes mellitus with hyperglycemia: Secondary | ICD-10-CM | POA: Diagnosis not present

## 2023-10-25 DIAGNOSIS — R059 Cough, unspecified: Secondary | ICD-10-CM | POA: Diagnosis not present

## 2023-10-25 DIAGNOSIS — K219 Gastro-esophageal reflux disease without esophagitis: Secondary | ICD-10-CM | POA: Diagnosis not present

## 2023-10-25 DIAGNOSIS — N3281 Overactive bladder: Secondary | ICD-10-CM | POA: Diagnosis not present

## 2023-10-25 DIAGNOSIS — J45909 Unspecified asthma, uncomplicated: Secondary | ICD-10-CM | POA: Diagnosis not present

## 2023-10-25 DIAGNOSIS — J3089 Other allergic rhinitis: Secondary | ICD-10-CM | POA: Diagnosis not present

## 2023-10-25 DIAGNOSIS — E1169 Type 2 diabetes mellitus with other specified complication: Secondary | ICD-10-CM | POA: Diagnosis not present

## 2023-10-25 DIAGNOSIS — J449 Chronic obstructive pulmonary disease, unspecified: Secondary | ICD-10-CM | POA: Diagnosis not present

## 2023-10-25 DIAGNOSIS — F329 Major depressive disorder, single episode, unspecified: Secondary | ICD-10-CM | POA: Diagnosis not present

## 2023-10-25 DIAGNOSIS — F411 Generalized anxiety disorder: Secondary | ICD-10-CM | POA: Diagnosis not present

## 2023-10-27 DIAGNOSIS — I10 Essential (primary) hypertension: Secondary | ICD-10-CM | POA: Diagnosis not present

## 2023-10-27 DIAGNOSIS — E1165 Type 2 diabetes mellitus with hyperglycemia: Secondary | ICD-10-CM | POA: Diagnosis not present

## 2023-10-27 DIAGNOSIS — E782 Mixed hyperlipidemia: Secondary | ICD-10-CM | POA: Diagnosis not present

## 2023-11-12 DIAGNOSIS — E1165 Type 2 diabetes mellitus with hyperglycemia: Secondary | ICD-10-CM | POA: Diagnosis not present

## 2023-12-13 DIAGNOSIS — E1165 Type 2 diabetes mellitus with hyperglycemia: Secondary | ICD-10-CM | POA: Diagnosis not present

## 2023-12-22 DIAGNOSIS — Z90710 Acquired absence of both cervix and uterus: Secondary | ICD-10-CM | POA: Diagnosis not present

## 2023-12-22 DIAGNOSIS — N952 Postmenopausal atrophic vaginitis: Secondary | ICD-10-CM | POA: Diagnosis not present

## 2023-12-22 DIAGNOSIS — Z01419 Encounter for gynecological examination (general) (routine) without abnormal findings: Secondary | ICD-10-CM | POA: Diagnosis not present

## 2023-12-22 DIAGNOSIS — N900 Mild vulvar dysplasia: Secondary | ICD-10-CM | POA: Diagnosis not present

## 2024-01-11 ENCOUNTER — Other Ambulatory Visit: Payer: Self-pay | Admitting: Family Medicine

## 2024-01-11 DIAGNOSIS — Z1231 Encounter for screening mammogram for malignant neoplasm of breast: Secondary | ICD-10-CM

## 2024-01-12 DIAGNOSIS — E1165 Type 2 diabetes mellitus with hyperglycemia: Secondary | ICD-10-CM | POA: Diagnosis not present

## 2024-01-23 DIAGNOSIS — E139 Other specified diabetes mellitus without complications: Secondary | ICD-10-CM | POA: Diagnosis not present

## 2024-01-23 DIAGNOSIS — H25813 Combined forms of age-related cataract, bilateral: Secondary | ICD-10-CM | POA: Diagnosis not present

## 2024-01-30 DIAGNOSIS — H40033 Anatomical narrow angle, bilateral: Secondary | ICD-10-CM | POA: Diagnosis not present

## 2024-01-30 DIAGNOSIS — H2513 Age-related nuclear cataract, bilateral: Secondary | ICD-10-CM | POA: Diagnosis not present

## 2024-02-12 DIAGNOSIS — E1165 Type 2 diabetes mellitus with hyperglycemia: Secondary | ICD-10-CM | POA: Diagnosis not present

## 2024-02-15 ENCOUNTER — Ambulatory Visit
Admission: RE | Admit: 2024-02-15 | Discharge: 2024-02-15 | Disposition: A | Discharge status: Home / Self Care | Source: Ambulatory Visit | Attending: Family Medicine | Admitting: Family Medicine

## 2024-02-15 DIAGNOSIS — Z1231 Encounter for screening mammogram for malignant neoplasm of breast: Secondary | ICD-10-CM

## 2024-02-16 DIAGNOSIS — H2511 Age-related nuclear cataract, right eye: Secondary | ICD-10-CM | POA: Diagnosis not present

## 2024-02-16 DIAGNOSIS — H25811 Combined forms of age-related cataract, right eye: Secondary | ICD-10-CM | POA: Diagnosis not present

## 2024-03-08 DIAGNOSIS — H2512 Age-related nuclear cataract, left eye: Secondary | ICD-10-CM | POA: Diagnosis not present
